# Patient Record
Sex: Male | Born: 1945 | Race: White | Hispanic: No | Marital: Married | Smoking: Never smoker
Health system: Southern US, Community
[De-identification: ages and names within clinical notes are randomized; demographics above are authoritative.]

## PROBLEM LIST (undated history)

## (undated) DIAGNOSIS — E119 Type 2 diabetes mellitus without complications: Secondary | ICD-10-CM

## (undated) DIAGNOSIS — E669 Obesity, unspecified: Secondary | ICD-10-CM

## (undated) DIAGNOSIS — G4733 Obstructive sleep apnea (adult) (pediatric): Secondary | ICD-10-CM

## (undated) DIAGNOSIS — G1221 Amyotrophic lateral sclerosis: Secondary | ICD-10-CM

## (undated) DIAGNOSIS — E785 Hyperlipidemia, unspecified: Secondary | ICD-10-CM

## (undated) DIAGNOSIS — F32A Depression, unspecified: Secondary | ICD-10-CM

## (undated) DIAGNOSIS — G4700 Insomnia, unspecified: Secondary | ICD-10-CM

## (undated) DIAGNOSIS — F329 Major depressive disorder, single episode, unspecified: Secondary | ICD-10-CM

## (undated) DIAGNOSIS — I10 Essential (primary) hypertension: Secondary | ICD-10-CM

## (undated) DIAGNOSIS — N400 Enlarged prostate without lower urinary tract symptoms: Secondary | ICD-10-CM

## (undated) DIAGNOSIS — M1990 Unspecified osteoarthritis, unspecified site: Secondary | ICD-10-CM

## (undated) DIAGNOSIS — F419 Anxiety disorder, unspecified: Secondary | ICD-10-CM

## (undated) DIAGNOSIS — K219 Gastro-esophageal reflux disease without esophagitis: Secondary | ICD-10-CM

## (undated) DIAGNOSIS — J329 Chronic sinusitis, unspecified: Secondary | ICD-10-CM

## (undated) DIAGNOSIS — E876 Hypokalemia: Secondary | ICD-10-CM

## (undated) HISTORY — DX: Obstructive sleep apnea (adult) (pediatric): G47.33

## (undated) HISTORY — DX: Hyperlipidemia, unspecified: E78.5

## (undated) HISTORY — DX: Type 2 diabetes mellitus without complications: E11.9

## (undated) HISTORY — DX: Insomnia, unspecified: G47.00

## (undated) HISTORY — DX: Benign prostatic hyperplasia without lower urinary tract symptoms: N40.0

## (undated) HISTORY — DX: Depression, unspecified: F32.A

## (undated) HISTORY — DX: Anxiety disorder, unspecified: F41.9

## (undated) HISTORY — DX: Chronic sinusitis, unspecified: J32.9

## (undated) HISTORY — DX: Major depressive disorder, single episode, unspecified: F32.9

## (undated) HISTORY — DX: Amyotrophic lateral sclerosis: G12.21

## (undated) HISTORY — DX: Unspecified osteoarthritis, unspecified site: M19.90

## (undated) HISTORY — DX: Hypokalemia: E87.6

## (undated) HISTORY — DX: Obesity, unspecified: E66.9

## (undated) HISTORY — DX: Essential (primary) hypertension: I10

---

## 1992-05-29 HISTORY — PX: INGUINAL HERNIA REPAIR: SUR1180

## 1996-05-29 HISTORY — PX: CHOLECYSTECTOMY: SHX55

## 1998-08-27 ENCOUNTER — Other Ambulatory Visit: Admission: RE | Admit: 1998-08-27 | Discharge: 1998-08-27 | Payer: Self-pay | Admitting: Gastroenterology

## 1999-09-08 ENCOUNTER — Encounter (INDEPENDENT_AMBULATORY_CARE_PROVIDER_SITE_OTHER): Payer: Self-pay | Admitting: Specialist

## 1999-09-08 ENCOUNTER — Other Ambulatory Visit: Admission: RE | Admit: 1999-09-08 | Discharge: 1999-09-08 | Payer: Self-pay | Admitting: Gastroenterology

## 2000-09-04 ENCOUNTER — Other Ambulatory Visit: Admission: RE | Admit: 2000-09-04 | Discharge: 2000-09-04 | Payer: Self-pay | Admitting: Gastroenterology

## 2000-09-04 ENCOUNTER — Encounter (INDEPENDENT_AMBULATORY_CARE_PROVIDER_SITE_OTHER): Payer: Self-pay

## 2001-06-23 ENCOUNTER — Emergency Department (HOSPITAL_COMMUNITY): Admission: EM | Admit: 2001-06-23 | Discharge: 2001-06-23 | Payer: Self-pay | Admitting: Emergency Medicine

## 2002-05-11 ENCOUNTER — Ambulatory Visit (HOSPITAL_COMMUNITY): Admission: RE | Admit: 2002-05-11 | Discharge: 2002-05-11 | Payer: Self-pay | Admitting: Internal Medicine

## 2002-07-03 ENCOUNTER — Ambulatory Visit (HOSPITAL_BASED_OUTPATIENT_CLINIC_OR_DEPARTMENT_OTHER): Admission: RE | Admit: 2002-07-03 | Discharge: 2002-07-03 | Payer: Self-pay | Admitting: Internal Medicine

## 2004-09-08 ENCOUNTER — Ambulatory Visit: Payer: Self-pay | Admitting: Gastroenterology

## 2004-11-15 ENCOUNTER — Ambulatory Visit: Payer: Self-pay | Admitting: Gastroenterology

## 2004-11-15 ENCOUNTER — Encounter (INDEPENDENT_AMBULATORY_CARE_PROVIDER_SITE_OTHER): Payer: Self-pay | Admitting: Specialist

## 2005-05-08 ENCOUNTER — Ambulatory Visit: Payer: Self-pay | Admitting: Gastroenterology

## 2006-10-02 ENCOUNTER — Ambulatory Visit (HOSPITAL_BASED_OUTPATIENT_CLINIC_OR_DEPARTMENT_OTHER): Admission: RE | Admit: 2006-10-02 | Discharge: 2006-10-02 | Payer: Self-pay | Admitting: Orthopaedic Surgery

## 2009-12-01 ENCOUNTER — Encounter: Payer: Self-pay | Admitting: Pulmonary Disease

## 2009-12-23 DIAGNOSIS — E785 Hyperlipidemia, unspecified: Secondary | ICD-10-CM

## 2009-12-23 DIAGNOSIS — G4730 Sleep apnea, unspecified: Secondary | ICD-10-CM | POA: Insufficient documentation

## 2009-12-23 DIAGNOSIS — F411 Generalized anxiety disorder: Secondary | ICD-10-CM | POA: Insufficient documentation

## 2009-12-23 DIAGNOSIS — E669 Obesity, unspecified: Secondary | ICD-10-CM

## 2009-12-23 DIAGNOSIS — M1710 Unilateral primary osteoarthritis, unspecified knee: Secondary | ICD-10-CM

## 2009-12-23 DIAGNOSIS — E876 Hypokalemia: Secondary | ICD-10-CM | POA: Insufficient documentation

## 2009-12-23 DIAGNOSIS — I10 Essential (primary) hypertension: Secondary | ICD-10-CM | POA: Insufficient documentation

## 2009-12-23 DIAGNOSIS — F329 Major depressive disorder, single episode, unspecified: Secondary | ICD-10-CM

## 2009-12-23 DIAGNOSIS — K5190 Ulcerative colitis, unspecified, without complications: Secondary | ICD-10-CM | POA: Insufficient documentation

## 2009-12-23 DIAGNOSIS — Z87898 Personal history of other specified conditions: Secondary | ICD-10-CM

## 2009-12-23 DIAGNOSIS — E119 Type 2 diabetes mellitus without complications: Secondary | ICD-10-CM | POA: Insufficient documentation

## 2009-12-23 DIAGNOSIS — G4700 Insomnia, unspecified: Secondary | ICD-10-CM

## 2009-12-24 ENCOUNTER — Ambulatory Visit: Payer: Self-pay | Admitting: Pulmonary Disease

## 2009-12-24 DIAGNOSIS — R05 Cough: Secondary | ICD-10-CM

## 2010-01-10 ENCOUNTER — Telehealth (INDEPENDENT_AMBULATORY_CARE_PROVIDER_SITE_OTHER): Payer: Self-pay | Admitting: *Deleted

## 2010-04-15 ENCOUNTER — Ambulatory Visit: Payer: Self-pay | Admitting: Cardiology

## 2010-04-15 ENCOUNTER — Ambulatory Visit: Payer: Self-pay | Admitting: Pulmonary Disease

## 2010-04-20 ENCOUNTER — Encounter: Payer: Self-pay | Admitting: Pulmonary Disease

## 2010-06-30 NOTE — Miscellaneous (Signed)
Summary: Orders Update  Clinical Lists Changes  Medications: Added new medication of AUGMENTIN 875-125 MG  TABS (AMOXICILLIN-POT CLAVULANATE) By mouth twice daily.  Take on full stomach with large glass of water. - Signed Rx of AUGMENTIN 875-125 MG  TABS (AMOXICILLIN-POT CLAVULANATE) By mouth twice daily.  Take on full stomach with large glass of water.;  #28 x 0;  Signed;  Entered by: Barbaraann Share MD;  Authorized by: Barbaraann Share MD;  Method used: Electronically to [facility] [address], [place], [place]  [id number], Ph: [phone], Fax: [phone]    Prescriptions: AUGMENTIN 875-125 MG  TABS (AMOXICILLIN-POT CLAVULANATE) By mouth twice daily.  Take on full stomach with large glass of water.  #28 x 0   Entered and Authorized by:   Barbaraann Share MD   Signed by:   Barbaraann Share MD on 04/20/2010   Method used:   Electronically to        [facility]* (retail)       [address]       [place], [place]  [id number]       Ph: [phone]       Fax: [phone]   RxID:   [id number]

## 2010-06-30 NOTE — Progress Notes (Signed)
Summary: TALK TO DR Tonny Branch  Phone Note Call from Patient Call back at [phone]   Caller: Patient Call For: [facility] Summary of Call: PT WAS TOLD TO CALL AND TALK TO DR [facility] AFTER STARTING MEDICATION. Initial call taken by: Rickard Patience,  January 10, 2010 10:55 AM  Follow-up for Phone Call        Pt c/o no significant difference in cough, had an episode early AM around 3 or 4 am coughing, having to sit in recliner for relief, wheezing, productive cough with clear mucus. Cough is less during the day. Does not feel Dexilant has helped and received little relief from Tussicaps. Pt wants to know the next step. Pt stated he has not had a chance to go to Dr. Kevan Ny office to get old CXR but will when he can for [facility] review. Please advise. Thanks. Zackery Barefoot CMA  January 10, 2010 11:08 AM   Additional Follow-up for Phone Call Additional follow up Details #1::        he needs ov to let me examine him and check cxr same day.  he needs to bring old film with him for comparison. Additional Follow-up by: Barbaraann Share MD,  January 10, 2010 5:23 PM    Additional Follow-up for Phone Call Additional follow up Details #2::    [id number].  Aundra Millet Reynolds LPN  January 11, 2010 9:12 AM     Spoke with pt-aware of KC's recommendations-states he just had a CXR-told him he needed to bring that with him to the appt-pt said okay and hung up. Was unable to make appt for pt and spoke with [facility] about this.Reynaldo Minium CMA  January 11, 2010 10:06 AM

## 2010-06-30 NOTE — Assessment & Plan Note (Signed)
Summary: acute sick visit for chronic cough   Visit Type:  Follow-up Primary Jouri Threat/Referring Trygve Thal:  Pearla Dubonnet  CC:  f/u. pt c/o cough w/ clear phlem and is  worse at night when lying down and wheezing at nightnever smoker.  History of Present Illness: the pt comes in today for f/u of his chronic cough.  At the last visit, this was felt to be more upper airway in origin than lower, and he was treated with the cyclical cough protocol and PPI for possible LPR.  His spirometry at the time was normal.  He comes in today where he continues to have a significant cough, and is getting frustrated as expected.  He does not feel the PPI or the cough suppressant helped.  He continues to have issues primarily at night while lying down, and describes classic upper airway pseudowheezing.  He describes a "choking sensation" in his upper airway.  Current Medications (verified): 1)  Zolpidem Tartrate 10 Mg Tabs (Zolpidem Tartrate) .Marland Kitchen.. 1 By Mouth At Bedtime As Needed 2)  Benicar Hct 40-12.5 Mg Tabs (Olmesartan Medoxomil-Hctz) .Marland Kitchen.. 1 By Mouth Daily 3)  Fish Oil 1000 Mg Caps (Omega-3 Fatty Acids) .... Take 3 Tabs By Mouth Daily 4)  Co Q-10 120 Mg Caps (Coenzyme Q10) .... Once Daily 5)  Klor-Con M20 20 Meq Cr-Tabs (Potassium Chloride Crys Cr) .... 1/2 By Mouth Daily 6)  Metformin Hcl 500 Mg Tabs (Metformin Hcl) .... 1/2 By Mouth Two Times A Day 7)  Norvasc 5 Mg Tabs (Amlodipine Besylate) .Marland Kitchen.. 1 1/2 By Mouth Daily 8)  Doxazosin Mesylate 4 Mg Tabs (Doxazosin Mesylate) .... Take 1 Tablet By Mouth Once A Day 9)  Omeprazole 40 Mg Cpdr (Omeprazole) .... One in Am and Pm  Allergies (verified): 1)  ! * Decongestants 2)  ! * Hytrin 3)  ! * Beta Blockers  Past History:  Past medical, surgical, family and social histories (including risk factors) reviewed, and no changes noted (except as noted below).  Past Medical History: Reviewed history from 12/24/2009 and no changes required.  UNSPECIFIED  ULCERATIVE COLITIS (ICD-556.9) DEGENERATIVE JOINT DISEASE, KNEES, BILATERAL (ICD-715.96) INSOMNIA (ICD-780.52) OBESITY (ICD-278.00) BENIGN PROSTATIC HYPERTROPHY, HX OF (ICD-V13.8) ANXIETY (ICD-300.00) HYPOKALEMIA (ICD-276.8) SLEEP APNEA (ICD-780.57) HYPERTENSION (ICD-401.9) HYPERLIPIDEMIA (ICD-272.4) DIABETES, TYPE 2 (ICD-250.00) DEPRESSION (ICD-311)  Past Surgical History: Reviewed history from 12/24/2009 and no changes required. Cholecystectomy in 1998 Left inguinal hernia repair in 10/1992  Family History: Reviewed history from 12/24/2009 and no changes required. asthma: uncle heart disease: father (m.i.)  Social History: Reviewed history from 12/24/2009 and no changes required. Patient never smoked.  pt is married and lives with wife. pt has children. pt is a retired International aid/development worker.   Review of Systems       The patient complains of productive cough and nasal congestion/difficulty breathing through nose.  The patient denies shortness of breath with activity, shortness of breath at rest, non-productive cough, coughing up blood, chest pain, irregular heartbeats, acid heartburn, indigestion, loss of appetite, weight change, abdominal pain, difficulty swallowing, sore throat, tooth/dental problems, headaches, sneezing, itching, ear ache, anxiety, depression, hand/feet swelling, joint stiffness or pain, rash, change in color of mucus, and fever.    Vital Signs:  Patient profile:   65 year old male Height:      69 inches Weight:      213.25 pounds BMI:     31.61 O2 Sat:      96 % on Room air Temp:     98.0 degrees F  oral Pulse rate:   68 / minute BP sitting:   140 / 90  (left arm) Cuff size:   regular  Vitals Entered By: Carver Fila (April 15, 2010 3:37 PM)  O2 Flow:  Room air CC: f/u. pt c/o cough w/ clear phlem and is  worse at night when lying down, wheezing at nightnever smoker Comments meds and allergies updated Phone number updated Carver Fila  April 15, 2010 3:37 PM    Physical Exam  General:  ow male in nad Nose:  no purulence from nares or drainage Mouth:  clear, no exudates Lungs:  totally clear to auscultation Heart:  rrr, on mrg Extremities:  no significant edema or cyanosis  Neurologic:  alert and oriented, moves all 4.   Impression & Recommendations:  Problem # 1:  COUGH, CHRONIC (ICD-786.2) the pt continues to have ongoing cough that occurs primarily at night, and sounds most c/w an upper airway source.  He has not responded to a SHORT course of PPI and cough suppression, and we need to do further investigation at this point.  Will need to have limited ct of his sinuses to r/o chronic sinusitis, and I would like to continue with PPI during this time.  If this fails to show anything, would proceed with methacholine challenge testing to r/o cough variant asthma.  The pt is agreeable with this approach  Medications Added to Medication List This Visit: 1)  Co Q-10 120 Mg Caps (Coenzyme q10) .... Once daily 2)  Symbicort 80-4.5 Mcg/act Aero (Budesonide-formoterol fumarate) .... As needed 3)  Omeprazole 40 Mg Cpdr (Omeprazole) .... One in am and pm  Other Orders: Radiology Referral (Radiology) Pulmonary Referral (Pulmonary) Est. Patient Level IV (62952)  Patient Instructions: 1)  will treat emperically with omeprazole 40mg  one in am and pm 2)  will schedule for scan of your sinuses..will call you with results. 3)  will schedule for methacholine challenge testing, but can cancel if something turns up on sinus xrays.   Prescriptions: OMEPRAZOLE 40 MG CPDR (OMEPRAZOLE) one in am and pm  #60 x 0   Entered and Authorized by:   Barbaraann Share MD   Signed by:   Barbaraann Share MD on 04/15/2010   Method used:   Print then Give to Patient   RxID:   [id number]    Immunization History:  Influenza Immunization History:    Influenza:  historical (02/26/2010)

## 2010-06-30 NOTE — Assessment & Plan Note (Signed)
Summary: self referral for chronic cough   Primary Provider/Referring Provider:  Pearla Dubonnet  CC:  self referral for chronic cough.  History of Present Illness: The pt is a 65y/o male who comes in today as a self referral for evaluation of chronic cough.  He noted the onset of cough approx. 4 mos ago, but unsure if associated with a URI?  It has not been progressive in nature, and he describes a "crackle and pop" in the middle of his chest at night.  His cough is primarily dry, but can produce thumbnails of mucus that are clear to yellow tinged.  He does have paroxysms of cough at night, and increase when he lies down to go to sleep.  What is unusual is that he denies any cough with heavy exercise.  He has some postnasal drip, but denies sinus issues or symptoms.  He denies any reflux symptoms, and has not been on an ACE recently.  He has had a cxr recently that is unremarkable.  He has been tried on prednisone for 10 days and symbicort, and the cough resolved as long as he was taking this.  He has never had spirometry.  He has not been on prednisone within the last 2 weeks.  He had been taking symbicort until just recently.  Preventive Screening-Counseling & Management  Alcohol-Tobacco     Smoking Status: never  Current Medications (verified): 1)  Sulfasalazine 500 Mg Tabs (Sulfasalazine) .... Take 1 Tablet By Mouth Two Times A Day 2)  Zolpidem Tartrate 10 Mg Tabs (Zolpidem Tartrate) .Marland Kitchen.. 1 By Mouth At Bedtime As Needed 3)  Benicar Hct 40-12.5 Mg Tabs (Olmesartan Medoxomil-Hctz) .Marland Kitchen.. 1 By Mouth Daily 4)  Fish Oil 1000 Mg Caps (Omega-3 Fatty Acids) .... Take 3 Tabs By Mouth Daily 5)  Coq10 100 Mg Caps (Coenzyme Q10) .Marland Kitchen.. 1 By Mouth Daily 6)  Klor-Con M20 20 Meq Cr-Tabs (Potassium Chloride Crys Cr) .... 1/2 By Mouth Daily 7)  Metformin Hcl 500 Mg Tabs (Metformin Hcl) .... 1/2 By Mouth Two Times A Day 8)  Norvasc 5 Mg Tabs (Amlodipine Besylate) .Marland Kitchen.. 1 1/2 By Mouth Daily 9)  Doxazosin  Mesylate 4 Mg Tabs (Doxazosin Mesylate) .... Take 1 Tablet By Mouth Once A Day  Allergies (verified): 1)  ! * Decongestants 2)  ! * Hytrin 3)  ! * Beta Blockers  Past History:  Past Medical History:  UNSPECIFIED ULCERATIVE COLITIS (ICD-556.9) DEGENERATIVE JOINT DISEASE, KNEES, BILATERAL (ICD-715.96) INSOMNIA (ICD-780.52) OBESITY (ICD-278.00) BENIGN PROSTATIC HYPERTROPHY, HX OF (ICD-V13.8) ANXIETY (ICD-300.00) HYPOKALEMIA (ICD-276.8) SLEEP APNEA (ICD-780.57) HYPERTENSION (ICD-401.9) HYPERLIPIDEMIA (ICD-272.4) DIABETES, TYPE 2 (ICD-250.00) DEPRESSION (ICD-311)  Past Surgical History: Cholecystectomy in 1998 Left inguinal hernia repair in 10/1992  Family History: Reviewed history and no changes required. asthma: uncle heart disease: father (m.i.)  Social History: Reviewed history and no changes required. Patient never smoked.  pt is married and lives with wife. pt has children. pt is a retired International aid/development worker. Smoking Status:  never  Review of Systems       The patient complains of productive cough.  The patient denies shortness of breath with activity, shortness of breath at rest, non-productive cough, coughing up blood, chest pain, irregular heartbeats, acid heartburn, indigestion, loss of appetite, weight change, abdominal pain, difficulty swallowing, sore throat, tooth/dental problems, headaches, nasal congestion/difficulty breathing through nose, sneezing, itching, ear ache, anxiety, depression, hand/feet swelling, joint stiffness or pain, rash, change in color of mucus, and fever.    Vital Signs:  Patient profile:  65 year old male Height:      69 inches Weight:      206.50 pounds BMI:     30.60 O2 Sat:      95 % on Room air Temp:     98.1 degrees F oral Pulse rate:   67 / minute BP sitting:   128 / 82  (left arm) Cuff size:   regular  Vitals Entered By: Arman Filter LPN (December 24, 2009 11:29 AM)  O2 Flow:  Room air CC: self referral for chronic  cough Comments Medications reviewed with patient Arman Filter LPN  December 24, 2009 11:29 AM    Physical Exam  General:  wd male in nad Eyes:  PERRLA and EOMI.  PERRLA and EOMI.   Nose:  patent on right, mod turbinate hypertrophy and edema on left with narrowing. Mouth:  large tonsils, no exudates, no lesions. Neck:  no LN, TMG, JVD Lungs:  totally clear to auscultation Heart:  rrr, no mrg Abdomen:  no swelling or tenderness, bs+ Extremities:  no edema or cyanosis pulses intact distally Neurologic:  alert and oriented, moves all 4.   Impression & Recommendations:  Problem # 1:  COUGH, CHRONIC (ICD-786.2) the pt has a chronic cough of 4mos duration of unclear etiology.  There are some features that suggest an upper airway origin, but on the other hand gets better with symbicort and prednisone.  I question whether he may have a postviral bronchiolitis.  His spirometry today is totally wnl, but he was taking symbicort until just recently.  At this point, would like to treat emperically for LPR and a cyclical cough.  I have also reviewed the behavioral treatments as well for an upper airway cough.  I have asked him to stay off symbicort during this time.  He is to let me know how things are going.  If he does not get better, will repeat spirometry off symbicort vs. methacholine challenge to put issue of ?asthma to rest.  Medications Added to Medication List This Visit: 1)  Sulfasalazine 500 Mg Tabs (Sulfasalazine) .... Take 1 tablet by mouth two times a day 2)  Fish Oil 1000 Mg Caps (Omega-3 fatty acids) .... Take 3 tabs by mouth daily 3)  Doxazosin Mesylate 4 Mg Tabs (Doxazosin mesylate) .... Take 1 tablet by mouth once a day 4)  Tussicaps 10-8 Mg Xr12h-cap (Hydrocod polst-chlorphen polst) .... One at hs  Other Orders: New Patient Level IV (29562)  Patient Instructions: 1)  trial off symbicort for 2 weeks. 2)  take dexilant 60mg  each am for next 2weeks 3)  tussicaps each night after  dinner for a week 4)  hard candy to bathe back of throat all during the day...no menthol or peppermint 5)  avoid throat clearing if possible....drink something.  Minimize voice use as well. 6)  Call me with progress in 2 weeks, but sooner if you think you are getting worse.  Prescriptions: TUSSICAPS 10-8 MG XR12H-CAP (HYDROCOD POLST-CHLORPHEN POLST) one at HS  #10 x 0   Entered and Authorized by:   Barbaraann Share MD   Signed by:   Barbaraann Share MD on 12/24/2009   Method used:   Print then Give to Patient   RxID:   [id number]    CardioPerfect Spirometry  ID: [id number] Patient: Paul Daniels DOB: 01-26-1946 Age: 65 Years Old Sex: Male Race: White Physician: Clance Height: 69 Weight: 206.50 Status: Unconfirmed Past Medical History:  Current Problems:  UNSPECIFIED ULCERATIVE COLITIS (ICD-556.9) DEGENERATIVE  JOINT DISEASE, KNEES, BILATERAL (ICD-715.96) INSOMNIA (ICD-780.52) OBESITY (ICD-278.00) BENIGN PROSTATIC HYPERTROPHY, HX OF (ICD-V13.8) ANXIETY (ICD-300.00) HYPOKALEMIA (ICD-276.8) SLEEP APNEA (ICD-780.57) HYPERTENSION (ICD-401.9) HYPERLIPIDEMIA (ICD-272.4) DIABETES, TYPE 2 (ICD-250.00) DEPRESSION (ICD-311)  Recorded: 12/24/2009 12:10 AM  Parameter  Measured Predicted %Predicted FVC     3.81        4.45        85.50 FEV1     2.98        3.33        89.30 FEV1%   78.18        74.84        104.50 PEF    11.66        8.66        134.60   Interpretation: spirometry is normal

## 2010-10-14 NOTE — Op Note (Signed)
NAMEARNET, HOFFERBER                ACCOUNT NO.:  [id number]   MEDICAL RECORD NO.:  [id number]          PATIENT TYPE:  AMB   LOCATION:  DSC                          FACILITY:  [facility]   PHYSICIAN:  Lubertha Basque. Dalldorf, M.D.DATE OF BIRTH:  Aug 17, 1945   DATE OF PROCEDURE:  10/02/2006  DATE OF DISCHARGE:                               OPERATIVE REPORT   PREOPERATIVE DIAGNOSES:  1. Left knee loose bodies.  2. Left knee degenerative joint disease.   POSTOPERATIVE DIAGNOSES:  1. Left knee torn medial meniscus.  2. Left knee degenerative joint disease.   PROCEDURES:  1. Left knee partial meniscectomy.  2. Left knee abrasion chondroplasty patellofemoral and lateral.   ANESTHESIA:  General.   ATTENDING SURGEON:  Lubertha Basque. Jerl Santos, M.D.   ASSISTANT:  Lindwood Qua, P.A.   INDICATIONS FOR PROCEDURE:  The patient is a 65 year old man with a long  history of left knee pain.  By x-ray he had some mild to moderate joint  space narrowing with two loose bodies which seemed evident and obvious  on the lateral view.  At least one of these seems to be in the posterior  aspect of the knee joint.  He has pain which limits his ability to  remain active and to rest.  He has failed some conservative measures and  he is offered an arthroscopy.  Informed operative consent was obtained  after discussion of possible complications including reaction to  anesthesia, infection, neurovascular injury.   SUMMARY OF FINDINGS AND PROCEDURE:  Under general anesthesia through  three portals an arthroscopy of the left knee was performed.  Suprapatellar pouch was benign while the patellofemoral joint exhibited  some grade 3 and 4 change.  A thorough chondroplasty was done along with  abrasion to bleeding bone in some small areas.  In the medial  compartment, he had a degenerative tear of the posterior horn of the  medial meniscus addressed with a partial medial meniscectomy, performing  about a 5% resection of the  structure back to stable tissues.  He also  had some grade-3 change across the broad area of the medial femoral  condyle addressed with a chondroplasty.  The ACL was intact.  The  lateral compartment exhibited some degeneration of the lateral meniscus  but no real tear was seen.  He did have a linear area of grade 4 change  across the lateral femoral condyle addressed with abrasion to bleeding  bone in some small areas.  We did enter the posterior aspect of the knee  but could find no significant loose bodies in that area.   DESCRIPTION OF PROCEDURE:  The patient was taken to the operating suite  where general anesthetic was applied without difficulty.  He was  positioned supine and prepped and draped in normal sterile fashion.  After the administration of IV Kefzol an arthroscopy of the left knee  was formed through a total of three portals.  Findings were as noted  above.  The procedure consisted of the partial medial meniscectomy  followed by abrasion chondroplasty, both lateral and patellofemoral.  We  did perform  a posteromedial arthroscopy portal first with a spinal  needle followed by a small skin incision and blunt dissection into the  capsule.  I examined the posterior aspect of the knee both medial and  lateral with the scope through the notch and the aforementioned  posteromedial instrument portal and could find no significant loose  body.  We did address some synovitis with the shaver but no bony loose  body could be found in this portion of the knee.  The knee was  thoroughly irrigated at the end of the case followed by placement of  Marcaine with epinephrine and morphine plus Depo-Medrol.  Adaptic was  placed over the portals followed by dry gauze and a loose Ace wrap.  Estimated blood loss and IV fluids can be obtained from anesthesia  records.   DISPOSITION:  The patient was extubated in the operating room and taken  to the recovery room in stable addition.  He was to go  home the same day  and follow up in the office next week.  I will contact him by phone  tonight.      Lubertha Basque Jerl Santos, M.D.  Electronically Signed     PGD/MEDQ  D:  10/02/2006  T:  10/02/2006  Job:  [id number]

## 2011-02-01 ENCOUNTER — Encounter: Payer: Self-pay | Admitting: Pulmonary Disease

## 2011-02-02 ENCOUNTER — Ambulatory Visit (INDEPENDENT_AMBULATORY_CARE_PROVIDER_SITE_OTHER): Payer: Medicare Other | Admitting: Pulmonary Disease

## 2011-02-02 ENCOUNTER — Encounter: Payer: Self-pay | Admitting: Pulmonary Disease

## 2011-02-02 VITALS — BP 142/86 | HR 70 | Temp 97.8°F | Ht 69.0 in | Wt 210.4 lb

## 2011-02-02 DIAGNOSIS — G4700 Insomnia, unspecified: Secondary | ICD-10-CM

## 2011-02-02 NOTE — Patient Instructions (Signed)
Will reschedule for a consult slot next week.

## 2011-02-02 NOTE — Progress Notes (Signed)
  Subjective:    Patient ID: Dominion Kathan, male    DOB: Oct 27, 1945, 65 y.o.   MRN: [id number]  HPI No visit.  He needs to be scheduled in a consult slot for evaluation of insomnia   Review of Systems  Constitutional: Negative for fever and unexpected weight change.  HENT: Positive for congestion. Negative for ear pain, nosebleeds, sore throat, rhinorrhea, sneezing, trouble swallowing, dental problem, postnasal drip and sinus pressure.   Eyes: Negative for redness and itching.  Respiratory: Negative for cough, chest tightness, shortness of breath and wheezing.   Cardiovascular: Negative for palpitations and leg swelling.  Gastrointestinal: Negative for nausea and vomiting.  Genitourinary: Negative for dysuria.  Musculoskeletal: Negative for joint swelling.  Skin: Negative for rash.  Neurological: Negative for headaches.  Hematological: Does not bruise/bleed easily.  Psychiatric/Behavioral: Negative for dysphoric mood. The patient is not nervous/anxious.        Objective:   Physical Exam        Assessment & Plan:

## 2011-02-07 ENCOUNTER — Other Ambulatory Visit: Payer: Self-pay | Admitting: Gastroenterology

## 2011-02-08 ENCOUNTER — Ambulatory Visit (INDEPENDENT_AMBULATORY_CARE_PROVIDER_SITE_OTHER): Payer: Medicare Other | Admitting: Pulmonary Disease

## 2011-02-08 ENCOUNTER — Encounter: Payer: Self-pay | Admitting: Pulmonary Disease

## 2011-02-08 VITALS — BP 122/78 | HR 62 | Temp 98.2°F | Ht 69.0 in | Wt 210.6 lb

## 2011-02-08 DIAGNOSIS — G4700 Insomnia, unspecified: Secondary | ICD-10-CM

## 2011-02-08 MED ORDER — TRAZODONE HCL 50 MG PO TABS: ORAL_TABLET | ORAL | Status: DC

## 2011-02-08 NOTE — Progress Notes (Signed)
Subjective:    Patient ID: Canyon Lohr, male    DOB: 08-22-1945, 65 y.o.   MRN: [id number]  HPI The patient is a 65 year old male who comes in today as a self-referral for evaluation of insomnia.  The patient states that he has had issues for at least 20 years, but probably predates even this.  The last few years it has gotten worse, especially the last 6 months.  He has had many different evaluations, including cognitive behavioral therapy at [facility].  However, he never participated in the full program by his description.  The patient currently does relaxation techniques and ritualistic behaviors before going to bed.  He tries to go to bed between 11 and 12 midnight, and does not watch TV in bed.  He does however read in bed.  If he takes multiple sleep medications, he can sleep for 3-4 hours maximum.  If he doesn't take sleep medications, a few nights he may get to sleep within 30 minutes, and others may be an hour or longer.  He has tried for his taking Xanax, Sinequan, Ambien, Elavil, and melatonin.  The patient feels that he is typically a sleep by 12:30 or 1:00, but will usually awaken 2-3 hours later.  He rarely goes back to sleep once he awakens.  He will usually leaves the bedroom and go to the family room where he will either watch TV, read, or illicit audio books.  He will often get on his computer, and we'll also fix something to eat.  6/7 nights he does not get back to sleep until 3 to 4 AM, and he awakens at 5:30 AM no matter how much sleep he has gotten.  In the mornings he will usually drink 2-4 cups of coffee but rarely any caffeinated beverages after 8:00.  He states he does not nap during the day except on rare circumstances.  His Epworth score today is zero   Review of Systems  Constitutional: Negative for fever and unexpected weight change.  HENT: Positive for congestion. Negative for ear pain, nosebleeds, sore throat, rhinorrhea, sneezing, trouble swallowing, dental  problem, postnasal drip and sinus pressure.   Eyes: Negative for redness and itching.  Respiratory: Negative for cough, chest tightness, shortness of breath and wheezing.   Cardiovascular: Negative for palpitations and leg swelling.  Gastrointestinal: Negative for nausea and vomiting.  Genitourinary: Negative for dysuria.  Musculoskeletal: Negative for joint swelling.  Skin: Negative for rash.  Neurological: Negative for headaches.  Hematological: Does not bruise/bleed easily.  Psychiatric/Behavioral: Negative for dysphoric mood. The patient is not nervous/anxious.        Objective:   Physical Exam Constitutional:  Well developed, no acute distress  HENT:  Nares patent without discharge, but narrowed on left  Oropharynx without exudate, palate and uvula are normal, increased tonsils.   Eyes:  Perrla, eomi, no scleral icterus  Neck:  No JVD, no TMG  Cardiovascular:  Normal rate, regular rhythm, no rubs or gallops.  No murmurs        Intact distal pulses  Pulmonary :  Normal breath sounds, no stridor or respiratory distress   No rales, rhonchi, or wheezing  Abdominal:  Soft, nondistended, bowel sounds present.  No tenderness noted.   Musculoskeletal:  No lower extremity edema noted.  Lymph Nodes:  No cervical lymphadenopathy noted  Skin:  No cyanosis noted  Neurologic:  Alert, appropriate, moves all 4 extremities without obvious deficit.         Assessment &  Plan:

## 2011-02-08 NOTE — Patient Instructions (Signed)
Try to avoid alcohol and your current sleep meds before bedtime Do not go to bed before 2-3am, and get out of bed around 7-8am if possible, no later Can take trazodone 50mg  near bedtime, and take melatonin 3mg  3-4 hours before bedtime. If cannot initiate sleep, do not stay in bed as you have been doing.   NO sleeping during day, no reading in bed, no computer after 10pm, and stay out of bedroom completely during day and early evening. No eating if you are up during the middle of the night Please call me in one to two weeks to give me feedback on how things are going.

## 2011-02-08 NOTE — Assessment & Plan Note (Signed)
The patient has long-standing issues of at least 20 years with sleep onset and maintenance.  However, in reality this is been going on even longer than this.  From his description, I suspect the patient is an "owl", and therefore this may simply be a manifestation of a circadian rhythm disturbance.  I had a long conversation with him about the various treatments for insomnia, and explained to him that medications are never the answer.  He has worked very hard on his sleep hygiene, however there are a few things that we can improve.  I have asked him to stay away from the computer at night, and not to read while in bed or eat when awake during the middle of the night.  I suspect he is getting more sleep than he realizes, and more than likely is catnapping during the early morning hours.  He has not really tried sleep restriction therapy, and I would like to start with this.  More than likely, he is going to need intense cognitive behavioral therapy.  He has never really been through the whole program.

## 2011-07-22 ENCOUNTER — Emergency Department (HOSPITAL_COMMUNITY)
Admission: EM | Admit: 2011-07-22 | Discharge: 2011-07-22 | Disposition: A | Discharge status: Home / Self Care | Payer: Medicare Other | Attending: Emergency Medicine | Admitting: Emergency Medicine

## 2011-07-22 ENCOUNTER — Encounter (HOSPITAL_COMMUNITY): Payer: Self-pay

## 2011-07-22 DIAGNOSIS — E785 Hyperlipidemia, unspecified: Secondary | ICD-10-CM | POA: Insufficient documentation

## 2011-07-22 DIAGNOSIS — R339 Retention of urine, unspecified: Secondary | ICD-10-CM | POA: Insufficient documentation

## 2011-07-22 DIAGNOSIS — F329 Major depressive disorder, single episode, unspecified: Secondary | ICD-10-CM | POA: Insufficient documentation

## 2011-07-22 DIAGNOSIS — F3289 Other specified depressive episodes: Secondary | ICD-10-CM | POA: Insufficient documentation

## 2011-07-22 DIAGNOSIS — N400 Enlarged prostate without lower urinary tract symptoms: Secondary | ICD-10-CM | POA: Insufficient documentation

## 2011-07-22 DIAGNOSIS — F411 Generalized anxiety disorder: Secondary | ICD-10-CM | POA: Insufficient documentation

## 2011-07-22 DIAGNOSIS — I10 Essential (primary) hypertension: Secondary | ICD-10-CM | POA: Insufficient documentation

## 2011-07-22 DIAGNOSIS — E119 Type 2 diabetes mellitus without complications: Secondary | ICD-10-CM | POA: Insufficient documentation

## 2011-07-22 DIAGNOSIS — M1990 Unspecified osteoarthritis, unspecified site: Secondary | ICD-10-CM | POA: Insufficient documentation

## 2011-07-22 LAB — URINALYSIS, ROUTINE W REFLEX MICROSCOPIC
Nitrite: NEGATIVE
Specific Gravity, Urine: 1.022 (ref 1.005–1.030)
Urobilinogen, UA: 0.2 mg/dL (ref 0.0–1.0)

## 2011-07-22 MED ORDER — MORPHINE SULFATE 4 MG/ML IJ SOLN
4.0000 mg | Freq: Once | INTRAMUSCULAR | Status: AC
  Administered 2011-07-22: 4 mg via INTRAMUSCULAR
  Filled 2011-07-22: qty 1

## 2011-07-22 NOTE — ED Provider Notes (Signed)
History     CSN: [id number]  Arrival date & time 07/22/11  4098   First MD Initiated Contact with Patient 07/22/11 1006      Chief Complaint  Patient presents with  . Urinary Retention    (Consider location/radiation/quality/duration/timing/severity/associated sxs/prior treatment) HPI History provided by pt.   Pt has h/o BPH and occasionally experiences urinary retention.  Was seen by urology for this problem 3 days ago and found to have 850cc urine retained in bladder.  A foley was not placed at that time.  He comes to the ED today because he is still unable to urinate.  Voided a few drops this am.  Called Dr. Wilson Singer who recommended he come to the ED for foley placement.  Pt denies fever, abdominal discomfort, dysuria, hematuria.    Past Medical History  Diagnosis Date  . Ulcerative colitis   . DJD (degenerative joint disease)     bilateral knee's  . Insomnia   . Obesity   . BPH (benign prostatic hyperplasia)   . Anxiety   . OSA (obstructive sleep apnea)   . Hypokalemia   . Hypertension   . Hyperlipidemia   . Diabetes mellitus, type 2   . Depression     Past Surgical History  Procedure Date  . Cholecystectomy 1998  . Inguinal hernia repair 1994    Family History  Problem Relation Age of Onset  . Asthma      uncle  . Heart attack Father     History  Substance Use Topics  . Smoking status: Never Smoker   . Smokeless tobacco: Not on file  . Alcohol Use: Not on file      Review of Systems  All other systems reviewed and are negative.    Allergies  Beta adrenergic blockers and Terazosin hcl  Home Medications   Current Outpatient Rx  Name Route Sig Dispense Refill  . ALPRAZOLAM 0.5 MG PO TABS  Take 1/2 to 1 tab at bedtime as needed.     Marland Kitchen AMLODIPINE BESYLATE 5 MG PO TABS  1 1/2 tablets daily     . CO Q-10 120 MG PO CAPS  Once a day     . DOXAZOSIN MESYLATE 4 MG PO TABS Oral Take 4 mg by mouth at bedtime.      Marland Kitchen DOXEPIN HCL 10 MG PO CAPS Oral Take 10  mg by mouth at bedtime as needed.      . OMEGA-3 FATTY ACIDS 1000 MG PO CAPS  3 tablets daily     . METFORMIN HCL ER (MOD) 500 MG PO TB24  1/2 tablet twice a day     . OLMESARTAN MEDOXOMIL-HCTZ 40-12.5 MG PO TABS Oral Take 1 tablet by mouth daily.      Marland Kitchen POTASSIUM CHLORIDE CRYS ER 20 MEQ PO TBCR  1/2 tablet twice a day     . TRAZODONE HCL 50 MG PO TABS  Take as directed 30 tablet 0  . ZOLPIDEM TARTRATE 10 MG PO TABS Oral Take 10 mg by mouth at bedtime as needed.        BP 152/96  Pulse 78  Temp(Src) 97.6 F (36.4 C) (Oral)  Resp 16  Ht 5\' 10"  (1.778 m)  Wt 180 lb (81.647 kg)  BMI 25.83 kg/m2  SpO2 99%  Physical Exam  Nursing note and vitals reviewed. Constitutional: He is oriented to person, place, and time. He appears well-developed and well-nourished. No distress.  HENT:  Head: Normocephalic and atraumatic.  Eyes:  Normal appearance  Neck: Normal range of motion.  Cardiovascular: Normal rate and regular rhythm.   Pulmonary/Chest: Effort normal and breath sounds normal.  Abdominal: Soft. Bowel sounds are normal. He exhibits no distension. There is no tenderness. There is no guarding.       No CVA tenderness  Neurological: He is alert and oriented to person, place, and time.  Skin: Skin is warm and dry. No rash noted.  Psychiatric: He has a normal mood and affect. His behavior is normal.    ED Course  Procedures (including critical care time)  Labs Reviewed  URINALYSIS, ROUTINE W REFLEX MICROSCOPIC - Abnormal; Notable for the following:    Glucose, UA 100 (*)    All other components within normal limits   No results found.   1. Urinary retention       MDM  Pt w/ h/o BPH referred to ED by his urologist for foley placement for urinary retention.  Pt is currently asymptomatic and in NAD.  Foley placed and 1500cc urine relieved.  U/A neg for infection.  Pt d/c'd home with a leg bag.  He has a f/u appt w/ Dr. Laverle Patter on 08/03/11 and was told that the foley would be  removed at that time.     Medical screening examination/treatment/procedure(s) were conducted as a shared visit with non-physician practitioner(s) and myself.  I personally evaluated the patient during the encounter 66 yo man with known BPH who developed urinary retention, with release of appx 1500 ml's of urine when catheter was inserted.  Leg bag, urology followup advised.  Pt says Dr. Bjorn Pippin, urologist, advised him to leave the catheter in until March 7, when he will have cystoscopy and urodynamic testing. Osvaldo Human, M.D.      Arie Sabina [place], PA 07/22/11 1143  Carleene Cooper III, MD 07/22/11 [phone]

## 2011-07-22 NOTE — ED Notes (Signed)
Pt was at urologist office on Wednesday and had 850 ml urinary retention, pt has not had much urine output since than and was told to come to ed for cath placement.

## 2011-07-22 NOTE — ED Notes (Signed)
Urologist called prior to PT arrival, states pt needs foley catheter and to follow up with them on monday

## 2011-07-22 NOTE — ED Notes (Signed)
1500 ml urine output after cath placement.

## 2011-07-22 NOTE — ED Notes (Signed)
Pt given discharge instructions and teaching for leg bag care , verb understanding, amb with steady gait to discharge windwo

## 2011-07-22 NOTE — Discharge Instructions (Signed)
Follow up with your urologist as scheduled.  You may return to the ER if you have any other concerns.

## 2011-08-04 ENCOUNTER — Encounter (HOSPITAL_COMMUNITY): Payer: Self-pay | Admitting: Pharmacy Technician

## 2011-08-04 ENCOUNTER — Other Ambulatory Visit: Payer: Self-pay | Admitting: Urology

## 2011-08-08 ENCOUNTER — Encounter (HOSPITAL_COMMUNITY)
Admission: RE | Admit: 2011-08-08 | Discharge: 2011-08-08 | Disposition: A | Discharge status: Home / Self Care | Payer: Medicare Other | Source: Ambulatory Visit | Attending: Urology | Admitting: Urology

## 2011-08-08 ENCOUNTER — Encounter (HOSPITAL_COMMUNITY): Payer: Self-pay

## 2011-08-08 LAB — BASIC METABOLIC PANEL
Chloride: 99 mEq/L (ref 96–112)
GFR calc Af Amer: >90 mL/min (ref >90)
GFR calc non Af Amer: 85 mL/min — ABNORMAL LOW (ref >90)
Potassium: 3.4 mEq/L — ABNORMAL LOW (ref 3.5–5.1)
Sodium: 138 mEq/L (ref 135–145)

## 2011-08-08 LAB — CBC
MCHC: 36.1 g/dL — ABNORMAL HIGH (ref 30.0–36.0)
Platelets: 161 10*3/uL (ref 150–400)
RDW: 12.9 % (ref 11.5–15.5)
WBC: 6.3 10*3/uL (ref 4.0–10.5)

## 2011-08-08 LAB — SURGICAL PCR SCREEN
MRSA, PCR: NEGATIVE
Staphylococcus aureus: NEGATIVE

## 2011-08-08 NOTE — Patient Instructions (Addendum)
20 Cordai Rodrigue  08/08/2011   Your procedure is scheduled on:  Thursday 08/10/2011 at 1100am  Report to [facility] at  0900 AM.  Call this number if you have problems the morning of surgery: [phone]   Remember:   Do not eat food:After Midnight.  May have clear liquids:until Midnight .  Clear liquids include soda, tea, black coffee, apple or grape juice, broth.  Take these medicines the morning of surgery with A SIP OF WATER: use Albuterol inhaler as needed and Flonase nasal spray if needed   Do not wear jewelry.  Do not wear lotions, powders.  Do not shave 48 hours prior to surgery.  Do not bring valuables to the hospital.  Contacts, dentures or bridgework may not be worn into surgery.  Leave suitcase in the car. After surgery it may be brought to your room.  For patients admitted to the hospital, checkout time is 11:00 AM the day of discharge.    Special Instructions: CHG Shower Use Special Wash: 1/2 bottle night before surgery and 1/2 bottle morning of surgery.   Please read over the following fact sheets that you were given: MRSA Information,sleep apnea,    Tomicka Lover pst phone number [phone]

## 2011-08-08 NOTE — Pre-Procedure Instructions (Signed)
Chest xray 06-28-11  [facility] on chart ekg 06-01-11 [facility] on chart

## 2011-08-09 NOTE — H&P (Signed)
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 He initially presented to me in February 2013 and was incidentally found to have very high PVRs of over 800cc. He subsequently developed urinary retention.  2) Prostate cancer screening: He has previously undergone PSA screening (always normal values) by Dr. Kevan Ny but has now elected to avoid PSA screening for prostate cancer.  Interval history:  Paul Daniels follows up today for further evaluation of his urinary retention. He is scheduled to undergo urodynamics and cystoscopy today. He developed urinary retention approximately 2 days after his most recent appointment when he was found to have a very elevated postvoid residual urine of 700 cc. This is despite chronic therapy with alpha blockers.   Past Medical History Problems  1. History of  Diabetes Mellitus 250.00 2. History of  Hypertension 401.9  Surgical History Problems  1. History of  Cholecystectomy 2. History of  Hernia Repair  Current Meds 1. ALPRAZolam 0.5 MG Oral Tablet; Therapy: 03Jan2013 to 2. AmLODIPine Besylate 5 MG Oral Tablet; takes 7.5mg ; Therapy: (Recorded:20Feb2013) to 3. Losartan Potassium-HCTZ 100-12.5 MG Oral Tablet; Therapy: 03Jan2013 to 4. MetFORMIN HCl 500 MG Oral Tablet; Therapy: 07May2012 to 5. ProAir HFA 108 (90 Base) MCG/ACT Inhalation Aerosol Solution; Therapy: 02Jan2013 to 6. Tamsulosin HCl 0.4 MG Oral Capsule; Therapy: 17Dec2012 to 7. Zolpidem Tartrate 10 MG Oral Tablet; Therapy: 04Jan2013 to  Allergies Medication  1. No Known Drug Allergies  Family History Problems  1. Paternal history of  Acute Myocardial Infarction V17.3 2. Maternal uncle's history of  Diabetes Mellitus V18.0 3. Maternal uncle's history of  Diabetes Mellitus V18.0  Social History Problems  1. Alcohol  Use 2-3 2. Former Smoker V15.82 somked socially when pt was younger 3. Marital History - Currently Married 4. Retired From Work retired Scientist, product/process development Vital Signs [Data Includes: Last 1 Day]  07Mar2013 03:05PM  Blood Pressure: 146 / 83 Heart Rate: 68  Physical Exam Constitutional: Well nourished and well developed . No acute distress.  ENT:. The ears and nose are normal in appearance.  Neck: The appearance of the neck is normal and no neck mass is present.  Pulmonary: No respiratory distress and normal respiratory rhythm and effort.  Cardiovascular:. No peripheral edema.  Genitourinary: Examination of the penis demonstrates no lesions and a normal meatus.  Skin: Normal skin turgor, no visible rash and no visible skin lesions.  Neuro/Psych:. Mood and affect are appropriate.    Results/Data  I reviewed his urodynamic study which is dictated separately. 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     Procedure  Procedure: Cystoscopy  Indication: Lower Urinary Tract Symptoms. Urinary retention.  Informed Consent: Risks, benefits, and potential adverse events were discussed and informed consent was obtained from the patient.  Premedication:. He was administered 4 mg of morphine at his request.  Prep: The patient was prepped with betadine.  Anesthesia:. Local anesthesia was administered intraurethrally with 2% lidocaine jelly.  Antibiotic prophylaxis: Ciprofloxacin.  Procedure Note:  Urethral meatus:. No abnormalities.  Anterior urethra: No abnormalities.  Prostatic urethra:. The lateral and median prostatic lobes were enlarged. An enlarged intravesical median lobe was visualized. His prostatic urethra was approximately 2.5 cm with a high bladder neck noted.  Bladder: Visulization was clear. The  ureteral orifices were in the normal anatomic position bilaterally and had clear efflux of urine. A  systematic survey of the bladder demonstrated no bladder tumors or stones. The mucosa was smooth without abnormalities. The patient tolerated the procedure well.   A new 16 French Foley catheter was placed after cystoscopy.  Complications: None.    Assessment Assessed  1. Acute Urinary Retention 788.20 2. Benign Localized Prostatic Hyperplasia 600.20  Plan Acute Urinary Retention (788.20)  1. Morphine Sulfate 15 MG/ML Injection Solution; INJECT 0.5  ML Intramuscular; Done: 07Mar2013  03:29PM; Status: COMPLETE 2. Follow-up Schedule Surgery Office  Follow-up  Done: 07Mar2013  Discussion/Summary 1. 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 Considering the evidence suggesting long-standing outlet obstruction with his evaluation today indicating multiple diverticuli and trabeculation, I have recommended proceeding with definitive surgical intervention with a transurethral resection of the prostate. We have had a detailed discussion reviewing the nature of this procedure and its indications as well as the potential risks and complications. These risks include but are not limited to bleeding, infection, risks of anesthesia, persistent urinary retention or voiding symptoms, need for further procedures, bladder neck contracture/stricture, erectile dysfunction, incontinence, and possible regrowth of prostate tissue, etc. We have discussed the expected postoperative recovery process. I recommended that he stop his aspirin one week prior to treatment. He will receive broad-spectrum prophylactic antibiotics considering his indwelling catheter.  Of note, he has not wished to undergo PSA screening for prostate cancer.  Cc: Dr. Johnella Moloney   a total of 40 minutes were spent in the overall care of the patient today Amended By: Heloise Purpura; 08/03/2011 5:51 PMEST with 25 minutes  in direct face to face consultation. Amended By: Heloise Purpura; 08/03/2011 5:51 PMEST    Verified Results URINE CULTURE1 07Mar2013 16:10RU0 Lyda Perone Source : CATHED SPECIMEN TYPE: CATH URINE [Aug 06, 2011 9:02PM Paul Daniels] Please call patient with culture result and instruct him to begin ciprofloxicin 3 days prior to his surgery date. This has been called in to his pharmacy.  Test Name Result Flag Reference CULTURE, URINE1 Culture, Urine1   ===== COLONY COUNT: =====  10,000 COLONIES/ML   FINAL REPORT: STAPHYLOCOCCUS SPECIES (COAGULASE NEGATIVE)  Rifampin and Gentamicin should not be used as single drugs for treatment of Staph infections.   Sensitivity for: STAPHYLOCOCCUS SPECIES (COAGULASE NEGATIVE)    PENICILLIN                             Sensitive     <=0.03    OXACILLIN                              Sensitive     <=0.25    CEFAZOLIN                              Sensitive               GENTAMICIN                             Sensitive      <=0.5    CIPROFLOXACIN  Sensitive      <=0.5    LEVOFLOXACIN                           Sensitive       0.25    NITROFURANTOIN                         Sensitive         32    VANCOMYCIN                             Sensitive      <=0.5    RIFAMPIN                               Sensitive      <=0.5    TETRACYCLINE                           Sensitive        <=1    END OF REPORT    1. Amended By: Heloise Purpura; 08/06/2011 9:02 PMEST  Signatures Electronically signed by : Heloise Purpura, M.D.; Aug 06 2011  9:02PM

## 2011-08-10 ENCOUNTER — Encounter (HOSPITAL_COMMUNITY)
Admission: RE | Disposition: A | Discharge status: Home / Self Care | Payer: Self-pay | Source: Ambulatory Visit | Attending: Urology

## 2011-08-10 ENCOUNTER — Ambulatory Visit (HOSPITAL_COMMUNITY): Payer: Medicare Other | Admitting: Anesthesiology

## 2011-08-10 ENCOUNTER — Ambulatory Visit (HOSPITAL_COMMUNITY)
Admission: RE | Admit: 2011-08-10 | Discharge: 2011-08-11 | Disposition: A | Discharge status: Home / Self Care | Payer: Medicare Other | Source: Ambulatory Visit | Attending: Urology | Admitting: Urology

## 2011-08-10 ENCOUNTER — Encounter (HOSPITAL_COMMUNITY): Payer: Self-pay | Admitting: Anesthesiology

## 2011-08-10 ENCOUNTER — Encounter (HOSPITAL_COMMUNITY): Payer: Self-pay

## 2011-08-10 DIAGNOSIS — N138 Other obstructive and reflux uropathy: Secondary | ICD-10-CM | POA: Insufficient documentation

## 2011-08-10 DIAGNOSIS — Z01812 Encounter for preprocedural laboratory examination: Secondary | ICD-10-CM | POA: Insufficient documentation

## 2011-08-10 DIAGNOSIS — I10 Essential (primary) hypertension: Secondary | ICD-10-CM | POA: Insufficient documentation

## 2011-08-10 DIAGNOSIS — Z87898 Personal history of other specified conditions: Secondary | ICD-10-CM

## 2011-08-10 DIAGNOSIS — R339 Retention of urine, unspecified: Secondary | ICD-10-CM | POA: Insufficient documentation

## 2011-08-10 DIAGNOSIS — N401 Enlarged prostate with lower urinary tract symptoms: Secondary | ICD-10-CM | POA: Insufficient documentation

## 2011-08-10 DIAGNOSIS — E119 Type 2 diabetes mellitus without complications: Secondary | ICD-10-CM | POA: Insufficient documentation

## 2011-08-10 DIAGNOSIS — N320 Bladder-neck obstruction: Secondary | ICD-10-CM | POA: Insufficient documentation

## 2011-08-10 DIAGNOSIS — N323 Diverticulum of bladder: Secondary | ICD-10-CM | POA: Insufficient documentation

## 2011-08-10 HISTORY — PX: CYSTOSCOPY: SHX5120

## 2011-08-10 HISTORY — PX: TRANSURETHRAL RESECTION OF PROSTATE: SHX73

## 2011-08-10 LAB — BASIC METABOLIC PANEL
Chloride: 104 mEq/L (ref 96–112)
Creatinine, Ser: 0.92 mg/dL (ref 0.50–1.35)
GFR calc Af Amer: >90 mL/min (ref >90)
Potassium: 3.9 mEq/L (ref 3.5–5.1)
Sodium: 138 mEq/L (ref 135–145)

## 2011-08-10 LAB — GLUCOSE, CAPILLARY
Glucose-Capillary: 142 mg/dL — ABNORMAL HIGH (ref 70–99)
Glucose-Capillary: 109 mg/dL — ABNORMAL HIGH (ref 70–99)

## 2011-08-10 SURGERY — TURP (TRANSURETHRAL RESECTION OF PROSTATE)
Anesthesia: Spinal | Site: Prostate | Wound class: Clean Contaminated

## 2011-08-10 MED ORDER — FENTANYL CITRATE 0.05 MG/ML IJ SOLN
INTRAMUSCULAR | Status: DC | PRN
  Administered 2011-08-10: 100 ug via INTRAVENOUS
  Administered 2011-08-10: 50 ug via INTRAVENOUS

## 2011-08-10 MED ORDER — KETAMINE HCL 10 MG/ML IJ SOLN
INTRAMUSCULAR | Status: DC | PRN
  Administered 2011-08-10 (×2): 10 mg via INTRAVENOUS

## 2011-08-10 MED ORDER — LACTATED RINGERS IV SOLN
INTRAVENOUS | Status: DC
  Administered 2011-08-10: 1000 mL via INTRAVENOUS
  Administered 2011-08-10: 12:00:00 via INTRAVENOUS

## 2011-08-10 MED ORDER — FLUTICASONE PROPIONATE 50 MCG/ACT NA SUSP
2.0000 | Freq: Every day | NASAL | Status: DC | PRN
  Filled 2011-08-10: qty 16

## 2011-08-10 MED ORDER — INSULIN ASPART 100 UNIT/ML ~~LOC~~ SOLN: 0.0000 [IU] | SUBCUTANEOUS | Status: DC

## 2011-08-10 MED ORDER — HYDROCHLOROTHIAZIDE 12.5 MG PO CAPS
12.5000 mg | ORAL_CAPSULE | Freq: Every day | ORAL | Status: DC
  Administered 2011-08-10 – 2011-08-11 (×2): 12.5 mg via ORAL
  Filled 2011-08-10 (×2): qty 1

## 2011-08-10 MED ORDER — SODIUM CHLORIDE 0.9 % IR SOLN
Status: DC | PRN
  Administered 2011-08-10: 15000 mL

## 2011-08-10 MED ORDER — CIPROFLOXACIN IN D5W 400 MG/200ML IV SOLN
400.0000 mg | Freq: Two times a day (BID) | INTRAVENOUS | Status: DC
  Administered 2011-08-10: 400 mg via INTRAVENOUS
  Filled 2011-08-10 (×2): qty 200

## 2011-08-10 MED ORDER — OXYCODONE HCL 5 MG PO TABS
5.0000 mg | ORAL_TABLET | ORAL | Status: DC | PRN
  Administered 2011-08-10 – 2011-08-11 (×3): 5 mg via ORAL
  Filled 2011-08-10 (×3): qty 1

## 2011-08-10 MED ORDER — HYDROMORPHONE HCL PF 1 MG/ML IJ SOLN: 0.2500 mg | INTRAMUSCULAR | Status: DC | PRN

## 2011-08-10 MED ORDER — PROPOFOL 10 MG/ML IV BOLUS
INTRAVENOUS | Status: DC | PRN
  Administered 2011-08-10: 10 mg via INTRAVENOUS
  Administered 2011-08-10: 20 mg via INTRAVENOUS

## 2011-08-10 MED ORDER — ALBUTEROL SULFATE HFA 108 (90 BASE) MCG/ACT IN AERS
1.0000 | INHALATION_SPRAY | RESPIRATORY_TRACT | Status: DC | PRN
  Filled 2011-08-10: qty 6.7

## 2011-08-10 MED ORDER — HYDROCODONE-ACETAMINOPHEN 5-500 MG PO CAPS: 1.0000 | ORAL_CAPSULE | Freq: Four times a day (QID) | ORAL | Status: AC | PRN

## 2011-08-10 MED ORDER — POTASSIUM CHLORIDE CRYS ER 20 MEQ PO TBCR
20.0000 meq | EXTENDED_RELEASE_TABLET | Freq: Every evening | ORAL | Status: DC
  Administered 2011-08-10: 20 meq via ORAL
  Filled 2011-08-10 (×2): qty 1

## 2011-08-10 MED ORDER — SODIUM CHLORIDE 0.9 % IV SOLN
1.5000 g | INTRAVENOUS | Status: AC
  Administered 2011-08-10: 1.5 g via INTRAVENOUS

## 2011-08-10 MED ORDER — CIPROFLOXACIN IN D5W 400 MG/200ML IV SOLN
400.0000 mg | INTRAVENOUS | Status: AC
  Administered 2011-08-10: 400 mg via INTRAVENOUS

## 2011-08-10 MED ORDER — ZOLPIDEM TARTRATE 5 MG PO TABS
5.0000 mg | ORAL_TABLET | Freq: Every evening | ORAL | Status: DC | PRN
  Administered 2011-08-10: 5 mg via ORAL
  Filled 2011-08-10: qty 1

## 2011-08-10 MED ORDER — SODIUM CHLORIDE 0.9 % IV SOLN: 250.0000 mL | INTRAVENOUS | Status: DC | PRN

## 2011-08-10 MED ORDER — LOSARTAN POTASSIUM 50 MG PO TABS
100.0000 mg | ORAL_TABLET | Freq: Every day | ORAL | Status: DC
  Administered 2011-08-10 – 2011-08-11 (×2): 100 mg via ORAL
  Filled 2011-08-10 (×2): qty 2

## 2011-08-10 MED ORDER — BACITRACIN-NEOMYCIN-POLYMYXIN 400-5-5000 EX OINT: 1.0000 "application " | TOPICAL_OINTMENT | Freq: Three times a day (TID) | CUTANEOUS | Status: DC | PRN

## 2011-08-10 MED ORDER — DOCUSATE SODIUM 100 MG PO CAPS: 100.0000 mg | ORAL_CAPSULE | Freq: Two times a day (BID) | ORAL | Status: AC

## 2011-08-10 MED ORDER — PROPOFOL 10 MG/ML IV EMUL
INTRAVENOUS | Status: DC | PRN
  Administered 2011-08-10: 25 ug/kg/min via INTRAVENOUS

## 2011-08-10 MED ORDER — MIDAZOLAM HCL 5 MG/5ML IJ SOLN
INTRAMUSCULAR | Status: DC | PRN
  Administered 2011-08-10 (×2): 1 mg via INTRAVENOUS
  Administered 2011-08-10: 2 mg via INTRAVENOUS
  Administered 2011-08-10: 1 mg via INTRAVENOUS

## 2011-08-10 MED ORDER — LOSARTAN POTASSIUM-HCTZ 100-12.5 MG PO TABS
1.0000 | ORAL_TABLET | Freq: Every morning | ORAL | Status: DC
Start: 2011-08-10 — End: 2011-08-10

## 2011-08-10 MED ORDER — DOCUSATE SODIUM 100 MG PO CAPS
100.0000 mg | ORAL_CAPSULE | Freq: Two times a day (BID) | ORAL | Status: DC
  Administered 2011-08-10 – 2011-08-11 (×3): 100 mg via ORAL
  Filled 2011-08-10 (×5): qty 1

## 2011-08-10 MED ORDER — DIPHENHYDRAMINE HCL 12.5 MG/5ML PO ELIX: 12.5000 mg | ORAL_SOLUTION | Freq: Four times a day (QID) | ORAL | Status: DC | PRN

## 2011-08-10 MED ORDER — HYDROCODONE-ACETAMINOPHEN 5-325 MG PO TABS: 1.0000 | ORAL_TABLET | ORAL | Status: DC | PRN

## 2011-08-10 MED ORDER — SODIUM CHLORIDE 0.9 % IJ SOLN: 3.0000 mL | INTRAMUSCULAR | Status: DC | PRN

## 2011-08-10 MED ORDER — ALPRAZOLAM 0.5 MG PO TABS
0.5000 mg | ORAL_TABLET | Freq: Every evening | ORAL | Status: DC | PRN
  Administered 2011-08-10: 0.75 mg via ORAL
  Filled 2011-08-10: qty 2

## 2011-08-10 MED ORDER — OXYCODONE HCL 5 MG PO TABS: 5.0000 mg | ORAL_TABLET | ORAL | Status: AC | PRN

## 2011-08-10 MED ORDER — AMLODIPINE BESYLATE 5 MG PO TABS
7.5000 mg | ORAL_TABLET | Freq: Every evening | ORAL | Status: DC
  Administered 2011-08-10: 7.5 mg via ORAL
  Filled 2011-08-10 (×2): qty 1

## 2011-08-10 MED ORDER — CIPROFLOXACIN HCL 500 MG PO TABS: 500.0000 mg | ORAL_TABLET | Freq: Two times a day (BID) | ORAL | Status: AC

## 2011-08-10 MED ORDER — SODIUM CHLORIDE 0.45 % IV SOLN
INTRAVENOUS | Status: DC
  Administered 2011-08-10 – 2011-08-11 (×2): via INTRAVENOUS

## 2011-08-10 MED ORDER — DIPHENHYDRAMINE HCL 50 MG/ML IJ SOLN: 12.5000 mg | Freq: Four times a day (QID) | INTRAMUSCULAR | Status: DC | PRN

## 2011-08-10 MED ORDER — BUPIVACAINE IN DEXTROSE 0.75-8.25 % IT SOLN
INTRATHECAL | Status: DC | PRN
  Administered 2011-08-10: 10 mg via INTRATHECAL

## 2011-08-10 MED ORDER — INSULIN ASPART 100 UNIT/ML ~~LOC~~ SOLN
0.0000 [IU] | Freq: Three times a day (TID) | SUBCUTANEOUS | Status: DC
  Administered 2011-08-11: 2 [IU] via SUBCUTANEOUS

## 2011-08-10 MED ORDER — SODIUM CHLORIDE 0.9 % IJ SOLN: 3.0000 mL | Freq: Two times a day (BID) | INTRAMUSCULAR | Status: DC

## 2011-08-10 MED ORDER — SODIUM CHLORIDE 0.9 % IV SOLN
INTRAVENOUS | Status: AC
  Filled 2011-08-10: qty 1.5

## 2011-08-10 MED ORDER — CIPROFLOXACIN IN D5W 400 MG/200ML IV SOLN
INTRAVENOUS | Status: AC
  Filled 2011-08-10: qty 200

## 2011-08-10 SURGICAL SUPPLY — 16 items
BAG URINE DRAINAGE (UROLOGICAL SUPPLIES) ×2 IMPLANT
BAG URO CATCHER STRL LF (DRAPE) ×4 IMPLANT
CATH HEMA 3WAY 30CC 22FR COUDE (CATHETERS) ×2 IMPLANT
CLOTH BEACON ORANGE TIMEOUT ST (SAFETY) ×4 IMPLANT
DRAPE CAMERA CLOSED 9X96 (DRAPES) ×4 IMPLANT
ELECT REM PT RETURN 9FT ADLT (ELECTROSURGICAL) ×4
ELECTRODE REM PT RTRN 9FT ADLT (ELECTROSURGICAL) ×3 IMPLANT
EVACUATOR MICROVAS BLADDER (UROLOGICAL SUPPLIES) IMPLANT
GLOVE BIOGEL M STRL SZ7.5 (GLOVE) ×4 IMPLANT
GOWN STRL NON-REIN LRG LVL3 (GOWN DISPOSABLE) ×8 IMPLANT
KIT ASPIRATION TUBING (SET/KITS/TRAYS/PACK) ×2 IMPLANT
LOOPS RESECTOSCOPE DISP (ELECTROSURGICAL) ×2 IMPLANT
MANIFOLD NEPTUNE II (INSTRUMENTS) ×4 IMPLANT
PACK CYSTO (CUSTOM PROCEDURE TRAY) ×4 IMPLANT
SYRINGE IRR TOOMEY STRL 70CC (SYRINGE) ×2 IMPLANT
TUBING CONNECTING 10 (TUBING) ×4 IMPLANT

## 2011-08-10 NOTE — Discharge Instructions (Signed)
1. You may see some blood in the urine and may have some burning with urination for 48-72 hours. You also may notice that you have to urinate more frequently or urgently after your procedure which is normal.  2. You should call should you develop an inability urinate, fever > 101, persistent nausea and vomiting that prevents you from eating or drinking to stay hydrated.  3. If you have a catheter, you will be taught how to take care of the catheter by the nursing staff prior to discharge from the hospital.  You may periodically feel a strong urge to void with the catheter in place.  This is a bladder spasm and most often can occur when having a bowel movement or moving around. It is typically self-limited and usually will stop after a few minutes.  You may use some Vaseline or Neosporin around the tip of the catheter to reduce friction at the tip of the penis. You may also see some blood in the urine.  A very small amount of blood can make the urine look quite red.  As long as the catheter is draining well, there usually is not a problem.  However, if the catheter is not draining well and is bloody, you should call the office [phone]) to notify us.   Hold vitamins and supplements and any aspirin or anti-inflammatory medications for 1 week or until urine is clear of any blood.  Refrain from lifting > 15 lbs or straining for 3 weeks.

## 2011-08-10 NOTE — Anesthesia Preprocedure Evaluation (Addendum)
Anesthesia Evaluation  Patient identified by MRN, date of birth, ID band Patient awake    Reviewed: Allergy & Precautions, H&P , NPO status , Patient's Chart, lab work & pertinent test results, reviewed documented beta blocker date and time   Airway Mallampati: II TM Distance: >3 FB Neck ROM: Full    Dental  (+) Dental Advisory Given   Pulmonary sleep apnea ,  Sleep study, CPAP recommended, non-compliant w/ CPAP breath sounds clear to auscultation        Cardiovascular hypertension, Pt. on medications Rhythm:Regular Rate:Normal  Denies cardiac symptoms   Neuro/Psych negative neurological ROS  negative psych ROS   GI/Hepatic negative GI ROS, Neg liver ROS,   Endo/Other  Diabetes mellitus-, Well Controlled, Type 2, Oral Hypoglycemic Agents  Renal/GU negative Renal ROS   Urinary retention/BPH    Musculoskeletal negative musculoskeletal ROS (+)   Abdominal   Peds negative pediatric ROS (+)  Hematology negative hematology ROS (+)   Anesthesia Other Findings Lost a filling last night, upper left  Reproductive/Obstetrics negative OB ROS                          Anesthesia Physical Anesthesia Plan  ASA: III  Anesthesia Plan: Spinal   Post-op Pain Management:    Induction: Intravenous  Airway Management Planned: Mask  Additional Equipment:   Intra-op Plan:   Post-operative Plan:   Informed Consent: I have reviewed the patients History and Physical, chart, labs and discussed the procedure including the risks, benefits and alternatives for the proposed anesthesia with the patient or authorized representative who has indicated his/her understanding and acceptance.   Dental advisory given  Plan Discussed with: CRNA and Surgeon  Anesthesia Plan Comments:         Anesthesia Quick Evaluation

## 2011-08-10 NOTE — Op Note (Signed)
Preoperative diagnosis: 1. Bladder outlet obstruction secondary to BPH 2. Urinary retention  Postoperative diagnosis:  1. Bladder outlet obstruction secondary to BPH 2. Urinary retention  Procedure:  1. Cystoscopy 2. Transurethral resection of the prostate  Surgeon: Moody Bruins. M.D.  Anesthesia: General  Complications: None  EBL: Minimal  Specimens: 1. Prostate Chips  Disposition of specimens: Pathology  Indication: Paul Daniels is a patient with bladder outlet obstruction secondary to benign prostatic hyperplasia. After reviewing the management options for treatment, he elected to proceed with the above surgical procedure(s). We have discussed the potential benefits and risks of the procedure, side effects of the proposed treatment, the likelihood of the patient achieving the goals of the procedure, and any potential problems that might occur during the procedure or recuperation. Informed consent has been obtained.  Description of procedure:  The patient was taken to the operating room and general anesthesia was induced.  The patient was placed in the dorsal lithotomy position, prepped and draped in the usual sterile fashion, and preoperative antibiotics were administered. A preoperative time-out was performed.   Cystourethroscopy was performed.  The patient's urethra was examined and demonstrated a 2.5 cm long prostatic urethra with bilobar prostatic hypertrophy with a median lobe.   The bladder was then systematically examined in its entirety. There was no evidence of any bladder tumors, stones, or other mucosal pathology. He was noted to have a large left sided diverticulum.  The ureteral orifices were identified and marked so as to be avoided during the procedure.  The prostate adenoma was then resected utilizing loop cautery resection with the monopolar/bipolar cutting loop.  The prostate adenoma from the bladder neck back to the verumontanum was resected  beginning at the six o'clock position and then extended to include the right and left lobes of the prostate and anterior prostate. Care was taken not to resect distal to the verumontanum.  Hemostasis was then achieved with the cautery and the bladder was emptied and reinspected with no significant bleeding noted at the end of the procedure.    A 3 way catheter was then placed into the bladder and placed on continuous bladder irrigation.  The patient appeared to tolerate the procedure well and without complications.  The patient was able to be awakened and transferred to the recovery unit in satisfactory condition.

## 2011-08-10 NOTE — Anesthesia Postprocedure Evaluation (Signed)
  Anesthesia Post-op Note  Patient: Paul Daniels  Procedure(s) Performed: Procedure(s) (LRB): CYSTOSCOPY (N/A) TRANSURETHRAL RESECTION OF THE PROSTATE (TURP) (N/A)  Patient Location: PACU  Anesthesia Type: Spinal  Level of Consciousness: oriented and sedated  Airway and Oxygen Therapy: Patient Spontanous Breathing and Patient connected to nasal cannula oxygen  Post-op Pain: none  Post-op Assessment: Post-op Vital signs reviewed, Patient's Cardiovascular Status Stable, Respiratory Function Stable and Patent Airway  Post-op Vital Signs: stable  Complications: No apparent anesthesia complications

## 2011-08-10 NOTE — Anesthesia Procedure Notes (Signed)
Spinal  Patient location during procedure: OR Start time: 08/10/2011 11:03 AM End time: 08/10/2011 11:13 AM Staffing Anesthesiologist: Lestine Box B Performed by: anesthesiologist  Preanesthetic Checklist Completed: patient identified, site marked, surgical consent, pre-op evaluation, timeout performed, IV checked, risks and benefits discussed and monitors and equipment checked Spinal Block Patient position: sitting Prep: Betadine and site prepped and draped Patient monitoring: heart rate, cardiac monitor, continuous pulse ox and blood pressure Approach: right paramedian Location: L3-4 Injection technique: single-shot Needle Needle type: Quincke  Needle gauge: 25 G Needle length: 9 cm Needle insertion depth: 3 cm Assessment Sensory level: T6 Additional Notes [id number], 2014-06

## 2011-08-10 NOTE — Progress Notes (Signed)
B met still pending

## 2011-08-10 NOTE — Interval H&P Note (Signed)
History and Physical Interval Note:  08/10/2011 10:21 AM  Paul Daniels  has presented today for surgery, with the diagnosis of urine retention  The various methods of treatment have been discussed with the patient and family. After consideration of risks, benefits and other options for treatment, the patient has consented to  Procedure(s) (LRB): TRANSURETHRAL RESECTION OF PROSTATE CYSTOSCOPY (N/A) as a surgical intervention .  The patients' history has been reviewed, patient examined, no change in status, stable for surgery.  Questions were answered to the patient's satisfaction.     Raven Harmes,LES

## 2011-08-10 NOTE — Progress Notes (Signed)
B met drawn by lab. 

## 2011-08-10 NOTE — Transfer of Care (Signed)
Immediate Anesthesia Transfer of Care Note  Patient: Paul Daniels  Procedure(s) Performed: Procedure(s) (LRB): CYSTOSCOPY (N/A) TRANSURETHRAL RESECTION OF THE PROSTATE (TURP) (N/A)  Patient Location: PACU  Anesthesia Type: MAC and Spinal  Level of Consciousness: awake, oriented, sedated and patient cooperative  Airway & Oxygen Therapy: Patient Spontanous Breathing and Patient connected to face mask oxygen  Post-op Assessment: Report given to PACU RN, Post -op Vital signs reviewed and stable and SAB level L1  Post vital signs: Reviewed and stable  Complications: No apparent anesthesia complications

## 2011-08-11 LAB — BASIC METABOLIC PANEL
CO2: 29 mEq/L (ref 19–32)
Calcium: 9.1 mg/dL (ref 8.4–10.5)
Creatinine, Ser: 0.96 mg/dL (ref 0.50–1.35)

## 2011-08-11 LAB — GLUCOSE, CAPILLARY: Glucose-Capillary: 162 mg/dL — ABNORMAL HIGH (ref 70–99)

## 2011-08-11 NOTE — Discharge Summary (Signed)
Date of admission: 08/10/2011  Date of discharge: 08/11/2011  Admission diagnosis: urinary retention secondary to BPH  Discharge diagnosis: urinary retention secondary to BPH  Secondary diagnoses: diabetes  History and Physical: For full details, please see admission history and physical. Briefly, Paul Daniels is a 66 y.o. year old patient with urinary retention secondary to BPH despite alpha-blocker therapy. He underwent evaluation including cystoscopy and urodynamic symptoms were consistent with obstruction related to an enlarged prostate. After reviewing options for treatment, he elected to proceed with transurethral resection of the prostate.Marland Kitchen   Hospital Course: on 08/10/11, he was taken to the operating room and underwent a transurethral resection of the prostate. He tolerated the procedure well without complications. Postoperatively, he was able to be transferred to a regular hospital room following recovery from anesthesia. He was maintained on continuous bladder irrigated with saline overnight. His urine was clear following titration of his continuous bladder irrigation and his catheter was able to be removed on the morning of postoperative day one. He passed his voiding trial and was able to be discharged home in good condition.  Laboratory values: No results found for this basename: HGB:3,HCT:3 in the last 72 hours  Basename 08/11/11 0743 08/10/11 1244  CREATININE 0.96 0.92    Disposition: Home  Discharge instruction: The patient was instructed to be ambulatory but told to refrain from heavy lifting, strenuous activity, or driving.  Discharge medications:  Despite the list below which was generated automatically by the electronic medical record, he only has been discharged home on prescription of ciprofloxacin, Colace, and oxycodone.  Medication List  As of 08/11/2011 11:08 PM   START taking these medications         * ciprofloxacin 500 MG tablet   Commonly known as: CIPRO   Take 1 tablet (500 mg total) by mouth 2 (two) times daily.      * ciprofloxacin 500 MG tablet   Commonly known as: CIPRO   Take 1 tablet (500 mg total) by mouth 2 (two) times daily.      * docusate sodium 100 MG capsule   Commonly known as: COLACE   Take 1 capsule (100 mg total) by mouth 2 (two) times daily.      * docusate sodium 100 MG capsule   Commonly known as: COLACE   Take 1 capsule (100 mg total) by mouth 2 (two) times daily.      * hydrocodone-acetaminophen 5-500 MG per capsule   Commonly known as: LORCET-HD   Take 1 capsule by mouth every 6 (six) hours as needed for pain.      * hydrocodone-acetaminophen 5-500 MG per capsule   Commonly known as: LORCET-HD   Take 1 capsule by mouth every 6 (six) hours as needed for pain.      oxyCODONE 5 MG immediate release tablet   Commonly known as: Oxy IR/ROXICODONE   Take 1 tablet (5 mg total) by mouth every 4 (four) hours as needed for pain.     * Notice: This list has 6 medication(s) that are the same as other medications prescribed for you. Read the directions carefully, and ask your doctor or other care provider to review them with you.       CONTINUE taking these medications         albuterol 108 (90 BASE) MCG/ACT inhaler   Commonly known as: PROVENTIL HFA;VENTOLIN HFA      ALPRAZolam 0.5 MG tablet   Commonly known as: XANAX      amLODipine  5 MG tablet   Commonly known as: NORVASC      fluticasone 50 MCG/ACT nasal spray   Commonly known as: FLONASE      KLOR-CON M20 20 MEQ tablet   Generic drug: potassium chloride SA      losartan-hydrochlorothiazide 100-12.5 MG per tablet   Commonly known as: HYZAAR      metFORMIN 500 MG (MOD) 24 hr tablet   Commonly known as: GLUMETZA      zolpidem 10 MG tablet   Commonly known as: AMBIEN         STOP taking these medications         Co Q-10 120 MG Caps      fish oil-omega-3 fatty acids 1000 MG capsule      multivitamins ther. w/minerals Tabs      Tamsulosin HCl  0.4 MG Caps          Where to get your medications    These are the prescriptions that you need to pick up.   You may get these medications from any pharmacy.         ciprofloxacin 500 MG tablet   docusate sodium 100 MG capsule   hydrocodone-acetaminophen 5-500 MG per capsule   oxyCODONE 5 MG immediate release tablet            Followup:  Follow-up Information    Please follow up. (09/05/11 at 10:30)

## 2011-08-11 NOTE — Progress Notes (Signed)
Patient ID: Paul Daniels, male   DOB: [birth year], 66 y.o.   MRN: [id number]  1 Day Post-Op Subjective: Pt without complaints except for irritation and pain around catheter at tip of penis.  On slow CBI drip currently.  Objective: Vital signs in last 24 hours: Temp:  [96.8 F (36 C)-98.5 F (36.9 C)] 98.5 F (36.9 C) (03/15 0540) Pulse Rate:  [50-67] 64  (03/15 0540) Resp:  [11-20] 16  (03/15 0540) BP: (112-150)/(68-89) 135/89 mmHg (03/15 0540) SpO2:  [95 %-100 %] 95 % (03/15 0540) Weight:  [89.812 kg (198 lb)] 89.812 kg (198 lb) (03/14 1335)  Intake/Output from previous day: 03/14 0701 - 03/15 0700 In: 1640 [P.O.:240; I.V.:1300; IV Piggyback:100] Out: -5575  Intake/Output this shift: Total I/O In: 1287.5 [I.V.:1287.5] Out: -   Physical Exam:  General: Alert and oriented Abdomen: Soft, ND GU: Urine clear on minimal drip  Lab Results:  Basename 08/08/11 1355  HGB 14.5  HCT 40.2   BMET  Basename 08/10/11 1244 08/08/11 1355  NA 138 138  K 3.9 3.4*  CL 104 99  CO2 28 28  GLUCOSE 145* 127*  BUN 15 14  CREATININE 0.92 0.97  CALCIUM 8.7 9.8     Studies/Results: No results found.  Assessment/Plan: 1) Check BMP 2) Voiding trial   LOS: 1 day   Madoc Holquin,LES 08/11/2011, 8:34 AM

## 2011-08-11 NOTE — Progress Notes (Signed)
Pt sat in chair for 2 hours and walked in room about 20 feet. Pt felt sensitivity d/t Foley when walking.  Paul Daniels, [name]

## 2011-08-23 ENCOUNTER — Encounter (HOSPITAL_COMMUNITY): Payer: Self-pay | Admitting: Urology

## 2011-09-05 DIAGNOSIS — N401 Enlarged prostate with lower urinary tract symptoms: Secondary | ICD-10-CM | POA: Diagnosis not present

## 2011-09-05 DIAGNOSIS — R339 Retention of urine, unspecified: Secondary | ICD-10-CM | POA: Diagnosis not present

## 2011-09-06 DIAGNOSIS — E119 Type 2 diabetes mellitus without complications: Secondary | ICD-10-CM | POA: Diagnosis not present

## 2011-09-15 DIAGNOSIS — H2510 Age-related nuclear cataract, unspecified eye: Secondary | ICD-10-CM | POA: Diagnosis not present

## 2011-09-15 DIAGNOSIS — E119 Type 2 diabetes mellitus without complications: Secondary | ICD-10-CM | POA: Diagnosis not present

## 2011-12-06 DIAGNOSIS — E7800 Pure hypercholesterolemia, unspecified: Secondary | ICD-10-CM | POA: Diagnosis not present

## 2011-12-06 DIAGNOSIS — I10 Essential (primary) hypertension: Secondary | ICD-10-CM | POA: Diagnosis not present

## 2011-12-06 DIAGNOSIS — Z1331 Encounter for screening for depression: Secondary | ICD-10-CM | POA: Diagnosis not present

## 2011-12-06 DIAGNOSIS — E119 Type 2 diabetes mellitus without complications: Secondary | ICD-10-CM | POA: Diagnosis not present

## 2011-12-28 DIAGNOSIS — E876 Hypokalemia: Secondary | ICD-10-CM | POA: Diagnosis not present

## 2011-12-28 DIAGNOSIS — L821 Other seborrheic keratosis: Secondary | ICD-10-CM | POA: Diagnosis not present

## 2011-12-28 DIAGNOSIS — D235 Other benign neoplasm of skin of trunk: Secondary | ICD-10-CM | POA: Diagnosis not present

## 2011-12-28 DIAGNOSIS — L570 Actinic keratosis: Secondary | ICD-10-CM | POA: Diagnosis not present

## 2012-01-12 DIAGNOSIS — N401 Enlarged prostate with lower urinary tract symptoms: Secondary | ICD-10-CM | POA: Diagnosis not present

## 2012-02-02 DIAGNOSIS — I10 Essential (primary) hypertension: Secondary | ICD-10-CM | POA: Diagnosis not present

## 2012-02-23 DIAGNOSIS — Z23 Encounter for immunization: Secondary | ICD-10-CM | POA: Diagnosis not present

## 2012-02-23 DIAGNOSIS — N401 Enlarged prostate with lower urinary tract symptoms: Secondary | ICD-10-CM | POA: Diagnosis not present

## 2012-03-28 DIAGNOSIS — E119 Type 2 diabetes mellitus without complications: Secondary | ICD-10-CM | POA: Diagnosis not present

## 2012-05-06 DIAGNOSIS — M19049 Primary osteoarthritis, unspecified hand: Secondary | ICD-10-CM | POA: Diagnosis not present

## 2012-05-06 DIAGNOSIS — D492 Neoplasm of unspecified behavior of bone, soft tissue, and skin: Secondary | ICD-10-CM | POA: Diagnosis not present

## 2012-05-06 DIAGNOSIS — L820 Inflamed seborrheic keratosis: Secondary | ICD-10-CM | POA: Diagnosis not present

## 2012-05-06 DIAGNOSIS — L570 Actinic keratosis: Secondary | ICD-10-CM | POA: Diagnosis not present

## 2012-06-11 DIAGNOSIS — Z79899 Other long term (current) drug therapy: Secondary | ICD-10-CM | POA: Diagnosis not present

## 2012-06-11 DIAGNOSIS — E782 Mixed hyperlipidemia: Secondary | ICD-10-CM | POA: Diagnosis not present

## 2012-06-11 DIAGNOSIS — E119 Type 2 diabetes mellitus without complications: Secondary | ICD-10-CM | POA: Diagnosis not present

## 2012-06-11 DIAGNOSIS — I10 Essential (primary) hypertension: Secondary | ICD-10-CM | POA: Diagnosis not present

## 2012-06-11 DIAGNOSIS — Z0000 Encounter for general adult medical examination without abnormal findings: Secondary | ICD-10-CM | POA: Diagnosis not present

## 2012-06-17 DIAGNOSIS — E119 Type 2 diabetes mellitus without complications: Secondary | ICD-10-CM | POA: Diagnosis not present

## 2012-06-17 DIAGNOSIS — Z79899 Other long term (current) drug therapy: Secondary | ICD-10-CM | POA: Diagnosis not present

## 2012-06-17 DIAGNOSIS — I10 Essential (primary) hypertension: Secondary | ICD-10-CM | POA: Diagnosis not present

## 2012-06-17 DIAGNOSIS — Z0000 Encounter for general adult medical examination without abnormal findings: Secondary | ICD-10-CM | POA: Diagnosis not present

## 2012-06-17 DIAGNOSIS — K5289 Other specified noninfective gastroenteritis and colitis: Secondary | ICD-10-CM | POA: Diagnosis not present

## 2012-06-17 DIAGNOSIS — G4700 Insomnia, unspecified: Secondary | ICD-10-CM | POA: Diagnosis not present

## 2012-06-17 DIAGNOSIS — E782 Mixed hyperlipidemia: Secondary | ICD-10-CM | POA: Diagnosis not present

## 2012-06-17 DIAGNOSIS — N400 Enlarged prostate without lower urinary tract symptoms: Secondary | ICD-10-CM | POA: Diagnosis not present

## 2012-07-13 DIAGNOSIS — S069X0A Unspecified intracranial injury without loss of consciousness, initial encounter: Secondary | ICD-10-CM | POA: Diagnosis not present

## 2012-07-13 DIAGNOSIS — F0781 Postconcussional syndrome: Secondary | ICD-10-CM | POA: Diagnosis not present

## 2012-07-25 DIAGNOSIS — D485 Neoplasm of uncertain behavior of skin: Secondary | ICD-10-CM | POA: Diagnosis not present

## 2012-08-05 DIAGNOSIS — E782 Mixed hyperlipidemia: Secondary | ICD-10-CM | POA: Diagnosis not present

## 2012-08-05 DIAGNOSIS — G4700 Insomnia, unspecified: Secondary | ICD-10-CM | POA: Diagnosis not present

## 2012-08-05 DIAGNOSIS — Z733 Stress, not elsewhere classified: Secondary | ICD-10-CM | POA: Diagnosis not present

## 2012-08-09 ENCOUNTER — Other Ambulatory Visit: Payer: Self-pay | Admitting: Ophthalmology

## 2012-08-09 DIAGNOSIS — D485 Neoplasm of uncertain behavior of skin: Secondary | ICD-10-CM | POA: Diagnosis not present

## 2012-08-09 DIAGNOSIS — L820 Inflamed seborrheic keratosis: Secondary | ICD-10-CM | POA: Diagnosis not present

## 2012-10-08 DIAGNOSIS — Z79899 Other long term (current) drug therapy: Secondary | ICD-10-CM | POA: Diagnosis not present

## 2012-10-08 DIAGNOSIS — E7800 Pure hypercholesterolemia, unspecified: Secondary | ICD-10-CM | POA: Diagnosis not present

## 2013-01-07 DIAGNOSIS — E119 Type 2 diabetes mellitus without complications: Secondary | ICD-10-CM | POA: Diagnosis not present

## 2013-01-23 DIAGNOSIS — E119 Type 2 diabetes mellitus without complications: Secondary | ICD-10-CM | POA: Diagnosis not present

## 2013-02-10 ENCOUNTER — Other Ambulatory Visit: Payer: Self-pay | Admitting: Gastroenterology

## 2013-02-19 ENCOUNTER — Encounter (HOSPITAL_COMMUNITY): Payer: Self-pay | Admitting: Pharmacy Technician

## 2013-02-19 ENCOUNTER — Encounter (HOSPITAL_COMMUNITY): Payer: Self-pay | Admitting: *Deleted

## 2013-03-18 ENCOUNTER — Encounter (HOSPITAL_COMMUNITY)
Admission: RE | Disposition: A | Discharge status: Home / Self Care | Payer: Self-pay | Source: Ambulatory Visit | Attending: Gastroenterology

## 2013-03-18 ENCOUNTER — Ambulatory Visit (HOSPITAL_COMMUNITY): Payer: Medicare Other | Admitting: *Deleted

## 2013-03-18 ENCOUNTER — Encounter (HOSPITAL_COMMUNITY): Payer: Self-pay | Admitting: *Deleted

## 2013-03-18 ENCOUNTER — Ambulatory Visit (HOSPITAL_COMMUNITY)
Admission: RE | Admit: 2013-03-18 | Discharge: 2013-03-18 | Disposition: A | Discharge status: Home / Self Care | Payer: Medicare Other | Source: Ambulatory Visit | Attending: Gastroenterology | Admitting: Gastroenterology

## 2013-03-18 ENCOUNTER — Encounter (HOSPITAL_COMMUNITY): Payer: Medicare Other | Admitting: *Deleted

## 2013-03-18 DIAGNOSIS — E119 Type 2 diabetes mellitus without complications: Secondary | ICD-10-CM | POA: Diagnosis not present

## 2013-03-18 DIAGNOSIS — G4733 Obstructive sleep apnea (adult) (pediatric): Secondary | ICD-10-CM | POA: Insufficient documentation

## 2013-03-18 DIAGNOSIS — K5730 Diverticulosis of large intestine without perforation or abscess without bleeding: Secondary | ICD-10-CM | POA: Diagnosis not present

## 2013-03-18 DIAGNOSIS — I10 Essential (primary) hypertension: Secondary | ICD-10-CM | POA: Diagnosis not present

## 2013-03-18 DIAGNOSIS — K5100 Ulcerative (chronic) pancolitis without complications: Secondary | ICD-10-CM | POA: Insufficient documentation

## 2013-03-18 DIAGNOSIS — K5190 Ulcerative colitis, unspecified, without complications: Secondary | ICD-10-CM | POA: Diagnosis not present

## 2013-03-18 HISTORY — PX: COLONOSCOPY WITH PROPOFOL: SHX5780

## 2013-03-18 LAB — GLUCOSE, CAPILLARY: Glucose-Capillary: 128 mg/dL — ABNORMAL HIGH (ref 70–99)

## 2013-03-18 SURGERY — COLONOSCOPY WITH PROPOFOL
Anesthesia: Monitor Anesthesia Care

## 2013-03-18 MED ORDER — PROPOFOL 10 MG/ML IV BOLUS
INTRAVENOUS | Status: DC | PRN
  Administered 2013-03-18: 50 mg via INTRAVENOUS

## 2013-03-18 MED ORDER — SODIUM CHLORIDE 0.9 % IV SOLN
INTRAVENOUS | Status: DC
  Administered 2013-03-18: 1000 mL via INTRAVENOUS
  Administered 2013-03-18: 12:00:00 via INTRAVENOUS

## 2013-03-18 MED ORDER — KETAMINE HCL 10 MG/ML IJ SOLN
INTRAMUSCULAR | Status: DC | PRN
  Administered 2013-03-18: 25 mg via INTRAVENOUS

## 2013-03-18 MED ORDER — MIDAZOLAM HCL 5 MG/5ML IJ SOLN
INTRAMUSCULAR | Status: DC | PRN
  Administered 2013-03-18 (×2): 1 mg via INTRAVENOUS

## 2013-03-18 MED ORDER — SPOT INK MARKER SYRINGE KIT
PACK | SUBMUCOSAL | Status: AC
  Filled 2013-03-18: qty 5

## 2013-03-18 MED ORDER — PROPOFOL INFUSION 10 MG/ML OPTIME
INTRAVENOUS | Status: DC | PRN
  Administered 2013-03-18: 100 ug/kg/min via INTRAVENOUS

## 2013-03-18 MED ORDER — LACTATED RINGERS IV SOLN: INTRAVENOUS | Status: DC

## 2013-03-18 MED ORDER — SPOT INK MARKER SYRINGE KIT
PACK | SUBMUCOSAL | Status: DC | PRN
  Administered 2013-03-18: 3 mL via SUBMUCOSAL

## 2013-03-18 MED ORDER — ONDANSETRON HCL 4 MG/2ML IJ SOLN
INTRAMUSCULAR | Status: DC | PRN
  Administered 2013-03-18: 4 mg via INTRAVENOUS

## 2013-03-18 SURGICAL SUPPLY — 22 items

## 2013-03-18 NOTE — Transfer of Care (Signed)
Immediate Anesthesia Transfer of Care Note  Patient: Paul Daniels  Procedure(s) Performed: Procedure(s): COLONOSCOPY WITH PROPOFOL (N/A)  Patient Location: PACU  Anesthesia Type:MAC  Level of Consciousness: sedated  Airway & Oxygen Therapy: Patient Spontanous Breathing and Patient connected to nasal cannula oxygen  Post-op Assessment: Report given to PACU RN and Post -op Vital signs reviewed and stable  Post vital signs: Reviewed and stable  Complications: No apparent anesthesia complications

## 2013-03-18 NOTE — Op Note (Signed)
Procedure: Surveillance colonoscopy. Universal ulcerative proctocolitis.  Endoscopist: Danise Edge  Premedication: Propofol administered by anesthesia  Procedure: The patient was placed in the left lateral decubitus position. Anal inspection and digital rectal exam were normal. The Pentax pediatric colonoscope was introduced into the rectum and advanced to the cecum. A normal-appearing ileocecal valve and appendiceal orifice were identified. Colonic preparation for the exam today was good.  Rectum . Normal. Retroflex view of the distal rectum normal.  Sigmoid colon and descending colon. Left colonic diverticulosis  Splenic flexure. Normal.  Transverse colon. Normal.  Hepatic flexure. An  inflammed appearing fold with punctate ulceration was biopsied to rule out neoplasm.  Ascending colon. Normal.  Cecum and ileocecal valve. Normal.  Assessment  #1. In active universal ulcerative proctocolitis  #2. Left colonic diverticulosis  #3. Inflamed-appearing fold in the hepatic flexure biopsied. The adjacent wall was tattooed with spot for future identification  #4. A total of 32 surveillance colon biopsies were performed. 8 biopsies were performed from the right colon, a biopsies were performed from the transverse colon, a biopsies were performed from the descending colon, a biopsies were performed from the rectosigmoid colon.

## 2013-03-18 NOTE — Preoperative (Signed)
Beta Blockers   Reason not to administer Beta Blockers:Not Applicable, not on home BB 

## 2013-03-18 NOTE — H&P (Signed)
  Problem: Universal ulcerative proctocolitis  History: The patient is a 67 year old male born January 28, 1946. 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 He is scheduled to undergo a surveillance colonoscopy.  Past medical history: Type 2 diabetes mellitus. Hypertension. Anxiety with depression. Hypercholesterolemia. Benign prostatic hypertrophy. Obstructive sleep apnea syndrome. Degenerative joint disease. Universal ulcerative proctocolitis. Cholecystectomy. Inguinal hernia repair.  Allergies: Beta blockers cause wheezing.  Exam: The patient is alert and lying comfortably on the endoscopy stretcher. Abdomen is soft and nontender to palpation. Lungs are clear to auscultation. Cardiac exam reveals a regular rhythm.  Plan: Proceed with surveillance colonoscopy.

## 2013-03-18 NOTE — Anesthesia Preprocedure Evaluation (Signed)
Anesthesia Evaluation  Patient identified by MRN, date of birth, ID band Patient awake    Reviewed: Allergy & Precautions, H&P , NPO status , Patient's Chart, lab work & pertinent test results, reviewed documented beta blocker date and time   Airway Mallampati: II TM Distance: >3 FB Neck ROM: Full    Dental  (+) Dental Advisory Given   Pulmonary sleep apnea ,  Sleep study, CPAP recommended, non-compliant w/ CPAP breath sounds clear to auscultation        Cardiovascular hypertension, Pt. on medications Rhythm:Regular Rate:Normal  Denies cardiac symptoms   Neuro/Psych negative neurological ROS  negative psych ROS   GI/Hepatic negative GI ROS, Neg liver ROS,   Endo/Other    Renal/GU negative Renal ROS   Urinary retention/BPH    Musculoskeletal negative musculoskeletal ROS (+)   Abdominal   Peds negative pediatric ROS (+)  Hematology negative hematology ROS (+)   Anesthesia Other Findings Lost a filling last night, upper left  Reproductive/Obstetrics negative OB ROS                           Anesthesia Physical  Anesthesia Plan  ASA: II  Anesthesia Plan: MAC   Post-op Pain Management:    Induction:   Airway Management Planned:   Additional Equipment:   Intra-op Plan:   Post-operative Plan:   Informed Consent: I have reviewed the patients History and Physical, chart, labs and discussed the procedure including the risks, benefits and alternatives for the proposed anesthesia with the patient or authorized representative who has indicated his/her understanding and acceptance.   Dental advisory given  Plan Discussed with: CRNA and Surgeon  Anesthesia Plan Comments:         Anesthesia Quick Evaluation

## 2013-03-18 NOTE — Anesthesia Postprocedure Evaluation (Signed)
  Anesthesia Post-op Note  Patient: Paul Daniels  Procedure(s) Performed: Procedure(s) (LRB): COLONOSCOPY WITH PROPOFOL (N/A)  Patient Location: PACU  Anesthesia Type: MAC  Level of Consciousness: awake and alert   Airway and Oxygen Therapy: Patient Spontanous Breathing  Post-op Pain: mild  Post-op Assessment: Post-op Vital signs reviewed, Patient's Cardiovascular Status Stable, Respiratory Function Stable, Patent Airway and No signs of Nausea or vomiting  Last Vitals:  Filed Vitals:   03/18/13 1200  BP: 112/74  Pulse: 76  Temp: 36.5 C  Resp: 16    Post-op Vital Signs: stable   Complications: No apparent anesthesia complications

## 2013-03-19 ENCOUNTER — Encounter (HOSPITAL_COMMUNITY): Payer: Self-pay | Admitting: Gastroenterology

## 2013-03-19 DIAGNOSIS — N401 Enlarged prostate with lower urinary tract symptoms: Secondary | ICD-10-CM | POA: Diagnosis not present

## 2013-03-20 DIAGNOSIS — Z23 Encounter for immunization: Secondary | ICD-10-CM | POA: Diagnosis not present

## 2013-03-31 DIAGNOSIS — F4323 Adjustment disorder with mixed anxiety and depressed mood: Secondary | ICD-10-CM | POA: Diagnosis not present

## 2013-04-09 DIAGNOSIS — F4323 Adjustment disorder with mixed anxiety and depressed mood: Secondary | ICD-10-CM | POA: Diagnosis not present

## 2013-04-15 DIAGNOSIS — D3701 Neoplasm of uncertain behavior of lip: Secondary | ICD-10-CM | POA: Diagnosis not present

## 2013-04-16 DIAGNOSIS — J069 Acute upper respiratory infection, unspecified: Secondary | ICD-10-CM | POA: Diagnosis not present

## 2013-04-17 DIAGNOSIS — N401 Enlarged prostate with lower urinary tract symptoms: Secondary | ICD-10-CM | POA: Diagnosis not present

## 2013-04-17 DIAGNOSIS — R339 Retention of urine, unspecified: Secondary | ICD-10-CM | POA: Diagnosis not present

## 2013-04-21 DIAGNOSIS — K1370 Unspecified lesions of oral mucosa: Secondary | ICD-10-CM | POA: Diagnosis not present

## 2013-04-29 DIAGNOSIS — F4323 Adjustment disorder with mixed anxiety and depressed mood: Secondary | ICD-10-CM | POA: Diagnosis not present

## 2013-05-09 DIAGNOSIS — G500 Trigeminal neuralgia: Secondary | ICD-10-CM | POA: Diagnosis not present

## 2013-07-08 DIAGNOSIS — E119 Type 2 diabetes mellitus without complications: Secondary | ICD-10-CM | POA: Diagnosis not present

## 2013-07-08 DIAGNOSIS — L820 Inflamed seborrheic keratosis: Secondary | ICD-10-CM | POA: Diagnosis not present

## 2013-07-08 DIAGNOSIS — Z1331 Encounter for screening for depression: Secondary | ICD-10-CM | POA: Diagnosis not present

## 2013-07-08 DIAGNOSIS — Z23 Encounter for immunization: Secondary | ICD-10-CM | POA: Diagnosis not present

## 2013-07-08 DIAGNOSIS — N400 Enlarged prostate without lower urinary tract symptoms: Secondary | ICD-10-CM | POA: Diagnosis not present

## 2013-07-08 DIAGNOSIS — M719 Bursopathy, unspecified: Secondary | ICD-10-CM | POA: Diagnosis not present

## 2013-07-08 DIAGNOSIS — Z0000 Encounter for general adult medical examination without abnormal findings: Secondary | ICD-10-CM | POA: Diagnosis not present

## 2013-07-08 DIAGNOSIS — R7989 Other specified abnormal findings of blood chemistry: Secondary | ICD-10-CM | POA: Diagnosis not present

## 2013-07-08 DIAGNOSIS — E559 Vitamin D deficiency, unspecified: Secondary | ICD-10-CM | POA: Diagnosis not present

## 2013-07-08 DIAGNOSIS — E782 Mixed hyperlipidemia: Secondary | ICD-10-CM | POA: Diagnosis not present

## 2013-07-08 DIAGNOSIS — I10 Essential (primary) hypertension: Secondary | ICD-10-CM | POA: Diagnosis not present

## 2013-07-08 DIAGNOSIS — M67919 Unspecified disorder of synovium and tendon, unspecified shoulder: Secondary | ICD-10-CM | POA: Diagnosis not present

## 2013-07-14 DIAGNOSIS — G479 Sleep disorder, unspecified: Secondary | ICD-10-CM | POA: Diagnosis not present

## 2013-08-25 DIAGNOSIS — G4700 Insomnia, unspecified: Secondary | ICD-10-CM | POA: Diagnosis not present

## 2013-10-06 DIAGNOSIS — G479 Sleep disorder, unspecified: Secondary | ICD-10-CM | POA: Diagnosis not present

## 2013-10-31 DIAGNOSIS — E119 Type 2 diabetes mellitus without complications: Secondary | ICD-10-CM | POA: Diagnosis not present

## 2013-10-31 DIAGNOSIS — J45909 Unspecified asthma, uncomplicated: Secondary | ICD-10-CM | POA: Diagnosis not present

## 2013-11-26 DIAGNOSIS — L570 Actinic keratosis: Secondary | ICD-10-CM | POA: Diagnosis not present

## 2013-11-26 DIAGNOSIS — B351 Tinea unguium: Secondary | ICD-10-CM | POA: Diagnosis not present

## 2013-11-26 DIAGNOSIS — B353 Tinea pedis: Secondary | ICD-10-CM | POA: Diagnosis not present

## 2013-12-16 DIAGNOSIS — G4700 Insomnia, unspecified: Secondary | ICD-10-CM | POA: Diagnosis not present

## 2014-01-05 DIAGNOSIS — F4323 Adjustment disorder with mixed anxiety and depressed mood: Secondary | ICD-10-CM | POA: Diagnosis not present

## 2014-01-21 DIAGNOSIS — H579 Unspecified disorder of eye and adnexa: Secondary | ICD-10-CM | POA: Diagnosis not present

## 2014-01-21 DIAGNOSIS — G4700 Insomnia, unspecified: Secondary | ICD-10-CM | POA: Diagnosis not present

## 2014-02-09 ENCOUNTER — Telehealth: Payer: Self-pay | Admitting: Pulmonary Disease

## 2014-02-09 NOTE — Telephone Encounter (Signed)
Pt is aware that [facility] had a cancellation on his schedule tomorrow for 2pm; pt to arrive at 1:45pm. Nothing more needed at this time.

## 2014-02-09 NOTE — Telephone Encounter (Signed)
Pt states he is aware of "new consult" time needed due to being over 3 years since seeing [place] last. Pt states he went to his PCP about 3 weeks ago and was given Pulmicort inhaler sample to use. Was told by PCP-N. Gates to contact [facility] for OV is no improvement. Pt is wheezing, SOB at times, coughing as well. He states these get worse at night for after eating a meal.   Pt is due to fly out of country for 10 days to [place] on 02-26-2014 and would like to be seen prior to this to make sure its safe to go out of the country. [place] you do not have any openings until 03-03-14 for consult;please advise as patient was seen last for pulmonary and sleep. Thanks

## 2014-02-09 NOTE — Telephone Encounter (Signed)
I am leaving to go OOT this Saturday, and won't be back until 9/29.  Either I have something this week that you can fit into a consult slot, or he may have to see someone else.  Let him know just bad timing for me to be able to work him in.

## 2014-02-10 ENCOUNTER — Ambulatory Visit (INDEPENDENT_AMBULATORY_CARE_PROVIDER_SITE_OTHER): Payer: Medicare Other | Admitting: Pulmonary Disease

## 2014-02-10 ENCOUNTER — Encounter: Payer: Self-pay | Admitting: Pulmonary Disease

## 2014-02-10 ENCOUNTER — Telehealth: Payer: Self-pay | Admitting: Pulmonary Disease

## 2014-02-10 VITALS — BP 120/78 | HR 83 | Temp 98.2°F | Ht 69.0 in | Wt 201.4 lb

## 2014-02-10 DIAGNOSIS — R062 Wheezing: Secondary | ICD-10-CM

## 2014-02-10 DIAGNOSIS — R059 Cough, unspecified: Secondary | ICD-10-CM

## 2014-02-10 DIAGNOSIS — R05 Cough: Secondary | ICD-10-CM | POA: Diagnosis not present

## 2014-02-10 DIAGNOSIS — R053 Chronic cough: Secondary | ICD-10-CM

## 2014-02-10 MED ORDER — AMOXICILLIN-POT CLAVULANATE 875-125 MG PO TABS: 1.0000 | ORAL_TABLET | Freq: Two times a day (BID) | ORAL | Status: DC

## 2014-02-10 MED ORDER — OMEPRAZOLE 40 MG PO CPDR: DELAYED_RELEASE_CAPSULE | ORAL | Status: DC

## 2014-02-10 NOTE — Telephone Encounter (Signed)
lmtcb x1 Pt needed to go down for CXR after OV.

## 2014-02-10 NOTE — Telephone Encounter (Signed)
lmomtcb x1 for pt 

## 2014-02-10 NOTE — Progress Notes (Signed)
   Subjective:    Patient ID: Paul Daniels, male    DOB: Nov 27, 1945, 68 y.o.   MRN: [id number]  HPI The patient is a 68 year old male who I've been asked to see for persistent cough and wheezing. I last saw him in 2000 or 11 with chronic cough, where he had normal spirometry and felt to have more of an upper airway source. He did not respond to cough suppression and a trial of proton pump inhibitors for possible LPR, but did have mild pansinusitis on sinus CT despite not having a lot of symptoms. He was treated with a course of antibiotics and had significant improvement. The patient tells me that his cough resolved, and was not an issue until approximately 3 months ago. He then began to notice increasing audible wheezing, which was worse at night after dinner and especially when lying down. He would cough up small quantities of nonpurulent mucus, his wheezing with then go away, and we have been start back once he accumulated a certain amount of mucus. He is denying any sinus symptoms or postnasal drip, and thinks that his cough is coming from his chest. He has not had any reflux symptoms, and again did not respond in the past to a trial of proton pump inhibitors. He is also had some worsening dyspnea on exertion. He has been tried on Pulmicort that he only took once a day, and has seen no change in his breathing.   Review of Systems  Constitutional: Negative for fever and unexpected weight change.  HENT: Positive for congestion. Negative for dental problem, ear pain, nosebleeds, postnasal drip, rhinorrhea, sinus pressure, sneezing, sore throat and trouble swallowing.   Eyes: Negative for redness and itching.  Respiratory: Positive for cough, shortness of breath and wheezing. Negative for chest tightness.   Cardiovascular: Negative for palpitations and leg swelling.  Gastrointestinal: Negative for nausea and vomiting.  Genitourinary: Negative for dysuria.  Musculoskeletal: Negative for joint swelling.   Skin: Negative for rash.  Neurological: Negative for headaches.  Hematological: Does not bruise/bleed easily.  Psychiatric/Behavioral: Negative for dysphoric mood. The patient is not nervous/anxious.        Objective:   Physical Exam Constitutional:  Well developed, no acute distress  HENT:  Nares patent without discharge, mild erythema  Oropharynx without exudate, palate and uvula are normal, +secretions pooling in back of throat.  Eyes:  Perrla, eomi, no scleral icterus  Neck:  No JVD, no TMG  Cardiovascular:  Normal rate, regular rhythm, no rubs or gallops.  No murmurs        Intact distal pulses  Pulmonary :  Normal breath sounds, no stridor or respiratory distress   No rales, rhonchi, or wheezing  Abdominal:  Soft, nondistended, bowel sounds present.  No tenderness noted.   Musculoskeletal:  No lower extremity edema noted.  Lymph Nodes:  No cervical lymphadenopathy noted  Skin:  No cyanosis noted  Neurologic:  Alert, appropriate, moves all 4 extremities without obvious deficit.         Assessment & Plan:

## 2014-02-10 NOTE — Assessment & Plan Note (Signed)
Patient has a chronic cough of 3 months duration that again sounds more upper airway in origin.  The last time he had this issue a CT of his sinuses showed mild pansinusitis, and his cough resolved with a course of antibiotics. His history today clearly sounds more upper airway, and his spirometry is normal. However, this does not exclude cough variant asthma. He also is having increased cough while eating and also lying down at night. This is very suspicious for postnasal drip and possible laryngopharyngeal reflux. At this point, I would like to treat him with a course of antibiotics for possible sinusitis, and also for postnasal drip and reflux. If he does not see significant improvement, I would consider a methacholine challenge test to rule out cough variant asthma.

## 2014-02-10 NOTE — Telephone Encounter (Signed)
Patient is wanting to know why he needs a chest xray.  He states he was the understanding that [facility] did not want him to go for xray.

## 2014-02-10 NOTE — Patient Instructions (Signed)
Take dymista 2 sprays each nostril each am for next 2 weeks. Take omeprazole 40mg  for possible acid reflux each night about 22min BEFORE dinnner on an empty stomach for next 2 weeks.  augmentin 875mg  one in am and pm on full stomach for one week.  Take with a large glass of water.  Please call me in 2 weeks with how things are going

## 2014-02-11 NOTE — Telephone Encounter (Signed)
Per Mindy, JC already spoke with the pt and nothing further needed Will close encounter

## 2014-02-18 ENCOUNTER — Telehealth: Payer: Self-pay | Admitting: Pulmonary Disease

## 2014-02-18 NOTE — Telephone Encounter (Signed)
Per 02/10/14 OV; Patient Instructions      Take dymista 2 sprays each nostril each am for next 2 weeks. Take omeprazole 40mg  for possible acid reflux each night about 35min BEFORE dinnner on an empty stomach for next 2 weeks.   augmentin 875mg  one in am and pm on full stomach for one week.  Take with a large glass of water.   Please call me in 2 weeks with how things are going   Called spoke with pt. He reports he is feeling some better. The problem he is having is when he is lying down he hears rattling in his chest, coughing, feels like upper chest congested. Pt has 1 day left of the ABX left. Pt reports when he is sitting he does not have this problem or any during the day. He is still using the dymista and taking the omeprazole. He does not feel like he is having any PND or reflux. I offered to send message to [facility] but he prefers to wait until [facility] comes back on Tuesday since he knows his "case". Please advise thanks

## 2014-02-24 NOTE — Telephone Encounter (Signed)
At this point, we have treated him aggressively for upper airway causes of cough.  If he is still having issues, would like to do methacholine challenge testing to evaluate him for asthma .  This involves breathing a medication that can stimulate asthma, and then breathing into the spirometer.  Very safe.  Let me know if he is ok with this and i can order.

## 2014-02-25 NOTE — Telephone Encounter (Signed)
Called spoke with pt. He reports he has much improved but when he is lying down he still wheezes. He wants to hold for the methacholine challenge for now. Nothing further needed

## 2014-03-23 ENCOUNTER — Telehealth: Payer: Self-pay | Admitting: Pulmonary Disease

## 2014-03-23 DIAGNOSIS — E119 Type 2 diabetes mellitus without complications: Secondary | ICD-10-CM | POA: Diagnosis not present

## 2014-03-23 DIAGNOSIS — H2513 Age-related nuclear cataract, bilateral: Secondary | ICD-10-CM | POA: Diagnosis not present

## 2014-03-23 NOTE — Telephone Encounter (Signed)
Called spoke with pt. He is willing to have the methacholine test done. He wants [place] to order and schedule this. Please advise [place] thanks

## 2014-03-23 NOTE — Telephone Encounter (Signed)
lmtcb X1 

## 2014-03-23 NOTE — Telephone Encounter (Signed)
Let pt know that wheezing that worsens at night upon lying down can be due to postnasal drip, reflux disease, heart disease, and finally asthma.  There is something called cough variant asthma where people cough but do not have asthma attacks.  It sometimes gives you normal breathing studies, but can be diagnosed by PROVOKING AN ATTACK with a medication called methacholine in order to make an accurate diagnosis.

## 2014-03-23 NOTE — Telephone Encounter (Signed)
Pt returned call.  Holly D Pryor ° °

## 2014-03-23 NOTE — Telephone Encounter (Signed)
Spoke with pt, he states he's been sleeping in a recliner for 2 months now since laying flat down causes him to cough, wheeze.  States his cough is prod with clear thin mucus.  He says that [facility] has discussed doing a methacholine challenge as his next diagnostic step, but wants to know why that is recommended if [place] or his pcp does not believe he has asthma. States he is willing to do the test if needed but would like to know why this is recommended.  Pt doesn't know if this can be discussed over the phone or if he needs an appt for this.  Dr Gwenette Greet please advise.  Thank you.

## 2014-03-24 ENCOUNTER — Other Ambulatory Visit: Payer: Self-pay | Admitting: Pulmonary Disease

## 2014-03-24 DIAGNOSIS — R053 Chronic cough: Secondary | ICD-10-CM

## 2014-03-24 DIAGNOSIS — R05 Cough: Secondary | ICD-10-CM

## 2014-03-24 NOTE — Telephone Encounter (Signed)
I had already ordered this earlier today, but can't find under referrals.  Talk with POC and see if this is in "[place]" of [facility] somewhere??

## 2014-03-24 NOTE — Telephone Encounter (Signed)
I spoke with the pt and notified that we are are working on this  He verbalized understanding  Will forward to [facility] to check the status of this being scheduled

## 2014-03-24 NOTE — Telephone Encounter (Signed)
PT is calling back because no one has called him to sched the meth challenge.  Pt is upset because he hasn't been able to sleep for days.  Satira Anis

## 2014-03-24 NOTE — Telephone Encounter (Signed)
[place], can we go ahead and schedule the MCT?

## 2014-03-25 DIAGNOSIS — Z23 Encounter for immunization: Secondary | ICD-10-CM | POA: Diagnosis not present

## 2014-03-25 NOTE — Telephone Encounter (Signed)
Pt has been rescheduled for meth to 03/30/14@10 :30am and he is aware of this appt Joellen Jersey

## 2014-03-27 DIAGNOSIS — N401 Enlarged prostate with lower urinary tract symptoms: Secondary | ICD-10-CM | POA: Diagnosis not present

## 2014-03-27 DIAGNOSIS — R3916 Straining to void: Secondary | ICD-10-CM | POA: Diagnosis not present

## 2014-03-30 ENCOUNTER — Ambulatory Visit (HOSPITAL_COMMUNITY)
Admission: RE | Admit: 2014-03-30 | Discharge: 2014-03-30 | Disposition: A | Discharge status: Home / Self Care | Payer: Medicare Other | Source: Ambulatory Visit | Attending: Pulmonary Disease | Admitting: Pulmonary Disease

## 2014-03-30 DIAGNOSIS — R0602 Shortness of breath: Secondary | ICD-10-CM | POA: Diagnosis not present

## 2014-03-30 DIAGNOSIS — F17210 Nicotine dependence, cigarettes, uncomplicated: Secondary | ICD-10-CM | POA: Diagnosis not present

## 2014-03-30 DIAGNOSIS — R053 Chronic cough: Secondary | ICD-10-CM

## 2014-03-30 DIAGNOSIS — R05 Cough: Secondary | ICD-10-CM | POA: Diagnosis not present

## 2014-03-30 LAB — PULMONARY FUNCTION TEST
FEF 25-75 PRE: 2.06 L/s
FEF 25-75 Post: 2.29 L/sec
FEF2575-%Change-Post: 11 %
FEF2575-%PRED-PRE: 83 %
FEF2575-%Pred-Post: 93 %
FEV1-%CHANGE-POST: 2 %
FEV1-%Pred-Post: 90 %
FEV1-%Pred-Pre: 87 %
FEV1-Post: 2.89 L
FEV1-Pre: 2.81 L
FEV1FVC-%Change-Post: 2 %
FEV1FVC-%Pred-Pre: 100 %
FEV6-%CHANGE-POST: 0 %
FEV6-%PRED-POST: 92 %
FEV6-%Pred-Pre: 91 %
FEV6-Post: 3.76 L
FEV6-Pre: 3.74 L
FEV6FVC-%CHANGE-POST: 0 %
FEV6FVC-%Pred-Post: 105 %
FEV6FVC-%Pred-Pre: 105 %
FVC-%Change-Post: 0 %
FVC-%PRED-POST: 87 %
FVC-%PRED-PRE: 87 %
FVC-POST: 3.79 L
FVC-Pre: 3.77 L
POST FEV6/FVC RATIO: 99 %
Post FEV1/FVC ratio: 76 %
Pre FEV1/FVC ratio: 74 %
Pre FEV6/FVC Ratio: 99 %

## 2014-03-30 MED ORDER — METHACHOLINE 16 MG/ML NEB SOLN
2.0000 mL | Freq: Once | RESPIRATORY_TRACT | Status: AC
  Administered 2014-03-30: 32 mg via RESPIRATORY_TRACT

## 2014-03-30 MED ORDER — METHACHOLINE 0.25 MG/ML NEB SOLN
2.0000 mL | Freq: Once | RESPIRATORY_TRACT | Status: AC
  Administered 2014-03-30: 0.5 mg via RESPIRATORY_TRACT

## 2014-03-30 MED ORDER — METHACHOLINE 1 MG/ML NEB SOLN
2.0000 mL | Freq: Once | RESPIRATORY_TRACT | Status: AC
  Administered 2014-03-30: 2 mg via RESPIRATORY_TRACT

## 2014-03-30 MED ORDER — ALBUTEROL SULFATE (2.5 MG/3ML) 0.083% IN NEBU
2.5000 mg | INHALATION_SOLUTION | Freq: Once | RESPIRATORY_TRACT | Status: AC
  Administered 2014-03-30: 2.5 mg via RESPIRATORY_TRACT

## 2014-03-30 MED ORDER — METHACHOLINE 0.0625 MG/ML NEB SOLN
2.0000 mL | Freq: Once | RESPIRATORY_TRACT | Status: AC
  Administered 2014-03-30: 0.125 mg via RESPIRATORY_TRACT

## 2014-03-30 MED ORDER — SODIUM CHLORIDE 0.9 % IN NEBU
3.0000 mL | INHALATION_SOLUTION | Freq: Once | RESPIRATORY_TRACT | Status: AC
  Administered 2014-03-30: 3 mL via RESPIRATORY_TRACT

## 2014-03-30 MED ORDER — METHACHOLINE 4 MG/ML NEB SOLN
2.0000 mL | Freq: Once | RESPIRATORY_TRACT | Status: AC
  Administered 2014-03-30: 8 mg via RESPIRATORY_TRACT

## 2014-03-31 ENCOUNTER — Encounter (HOSPITAL_COMMUNITY): Payer: Medicare Other

## 2014-04-03 ENCOUNTER — Telehealth: Payer: Self-pay | Admitting: Pulmonary Disease

## 2014-04-03 NOTE — Telephone Encounter (Signed)
Spoke with Paul Daniels. Had Methacholine Challenge done and is wanting results and what is the next step. Paul Daniels will not be back in town until Tuesday, so please call then. Please advise thanks

## 2014-04-06 ENCOUNTER — Ambulatory Visit (INDEPENDENT_AMBULATORY_CARE_PROVIDER_SITE_OTHER): Payer: Medicare Other | Admitting: Adult Health

## 2014-04-06 ENCOUNTER — Other Ambulatory Visit: Payer: Self-pay | Admitting: Pulmonary Disease

## 2014-04-06 ENCOUNTER — Ambulatory Visit (INDEPENDENT_AMBULATORY_CARE_PROVIDER_SITE_OTHER)
Admission: RE | Admit: 2014-04-06 | Discharge: 2014-04-06 | Disposition: A | Discharge status: Home / Self Care | Payer: Medicare Other | Source: Ambulatory Visit | Attending: Adult Health | Admitting: Adult Health

## 2014-04-06 ENCOUNTER — Encounter: Payer: Self-pay | Admitting: Adult Health

## 2014-04-06 VITALS — BP 122/96 | HR 92 | Temp 97.8°F | Ht 69.0 in | Wt 206.8 lb

## 2014-04-06 DIAGNOSIS — R509 Fever, unspecified: Secondary | ICD-10-CM | POA: Diagnosis not present

## 2014-04-06 DIAGNOSIS — R053 Chronic cough: Secondary | ICD-10-CM

## 2014-04-06 DIAGNOSIS — R05 Cough: Secondary | ICD-10-CM

## 2014-04-06 DIAGNOSIS — J069 Acute upper respiratory infection, unspecified: Secondary | ICD-10-CM | POA: Diagnosis not present

## 2014-04-06 MED ORDER — HYDROCODONE-HOMATROPINE 5-1.5 MG/5ML PO SYRP: 5.0000 mL | ORAL_SOLUTION | Freq: Four times a day (QID) | ORAL | Status: DC | PRN

## 2014-04-06 NOTE — Assessment & Plan Note (Signed)
URI /Viral illness Neg Flu swab in office  cxr w/ no acute process    Plan  Fluids and rest  mucinex As needed   Advil As needed  For aches and fever.  Saline nasal rinses As needed    follow up Dr. Gwenette Greet in 6 weeks and As needed   Please contact office for sooner follow up if symptoms do not improve or worsen or seek emergency care

## 2014-04-06 NOTE — Progress Notes (Signed)
Ov reviewed, and agree with plan as outlined.  

## 2014-04-06 NOTE — Progress Notes (Signed)
   Subjective:    Patient ID: Paul Daniels, male    DOB: [birth year], 68 y.o.   MRN: [id number]  HPI 68 yo male minimal smoking exposure , followed in pulmonary clinic for chronic cough.  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  Complains of chills, prod cough with clear mucus, fever up to 102.6 at onset.  symptoms began 1 day ago.  Has body aches . Took advil and feels better today with no fever.  He had flu shot 1 week ago.  Flu swab in office was neg.  CXR today shows no acute process.   He was recently seen in office chronic cough flare , tx w/ augmentin and cough control regimen  Does not feel cough is getting any better.  He was sent for MCT which was neg on 11/2.  No fever, chest pain, orthopnea, n/v/d, edema , hemoptysis.  Has post nasal drip . Denies reflux.     Review of Systems Constitutional:   No  weight loss, night sweats,  Fevers, chills, fatigue, or  lassitude.  HEENT:   No headaches,  Difficulty swallowing,  Tooth/dental problems, or  Sore throat,                No sneezing, itching, ear ache, nasal congestion, + post nasal drip,   CV:  No chest pain,  Orthopnea, PND, swelling in lower extremities, anasarca, dizziness, palpitations, syncope.   GI  No heartburn, indigestion, abdominal pain, nausea, vomiting, diarrhea, change in bowel habits, loss of appetite, bloody stools.   Resp:  .  No chest wall deformity  Skin: no rash or lesions.  GU: no dysuria, change in color of urine, no urgency or frequency.  No flank pain, no hematuria   MS:  No joint pain or swelling.  No decreased range of motion.  No back pain.  Psych:  No change in mood or affect. No depression or anxiety.  No memory loss.          Objective:   Physical Exam GEN: A/Ox3; pleasant , NAD  HEENT:  [place]/AT,  EACs-clear, TMs-wnl, NOSE-clear, THROAT-clear, no lesions, no postnasal drip or exudate  noted.   NECK:  Supple w/ fair ROM; no JVD; normal carotid impulses w/o bruits; no thyromegaly or nodules palpated; no lymphadenopathy.  RESP  Clear  P & A; w/o, wheezes/ rales/ or rhonchi.no accessory muscle use, no dullness to percussion  CARD:  RRR, no m/r/g  , no peripheral edema, pulses intact, no cyanosis or clubbing.  GI:   Soft & nt; nml bowel sounds; no organomegaly or masses detected.  Musco: Warm bil, no deformities or joint swelling noted.   Neuro: alert, no focal deficits noted.    Skin: Warm, no lesions or rashes         Assessment & Plan:

## 2014-04-06 NOTE — Assessment & Plan Note (Signed)
Normal Spirometry  Neg MCT  CXR w/ no acute process.  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  follow up Dr. Gwenette Greet in 6 weeks and As needed   Please contact office for sooner follow up if symptoms do not improve or worsen or seek emergency care

## 2014-04-06 NOTE — Patient Instructions (Addendum)
Fluids and rest  Advil As needed  For aches and fever.  Saline nasal rinses As needed   Take dymista 2 sprays each nostril each am  Take omeprazole 40mg  for possible acid reflux each night about 73min BEFORE dinnner on an empty stomach  Delsym 2 tsp Twice daily  For cough  Sips of water and sugarless candy for cough and throat clearing As needed   Hydromet 1 tsp every 6hr As needed  Cough, may make you sleepy.  follow up Dr. Gwenette Greet in 6 weeks and As needed   Please contact office for sooner follow up if symptoms do not improve or worsen or seek emergency care

## 2014-04-07 ENCOUNTER — Telehealth: Payer: Self-pay | Admitting: Adult Health

## 2014-04-07 NOTE — Telephone Encounter (Signed)
ATC line busy x 3 wcb 

## 2014-04-08 NOTE — Telephone Encounter (Signed)
Spoke with patient-states he got his concerns addressed by another MD-"he had to cath himself". Pt understands that our office attempted to reach him 3 times yesterday to address his concerns and his line stayed busy. Nothing more needed at this time.

## 2014-04-10 ENCOUNTER — Encounter: Payer: Self-pay | Admitting: Pulmonary Disease

## 2014-04-10 LAB — POCT INFLUENZA A/B
Influenza A, POC: NEGATIVE
Influenza B, POC: NEGATIVE

## 2014-04-10 MED ORDER — AZELASTINE-FLUTICASONE 137-50 MCG/ACT NA SUSP: 2.0000 | Freq: Every day | NASAL | Status: DC

## 2014-04-10 NOTE — Addendum Note (Signed)
Addended by: Parke Poisson E on: 04/10/2014 03:11 PM   Modules accepted: Orders

## 2014-04-10 NOTE — Telephone Encounter (Signed)
Spoke with pt. Aware of recs. He voiced understanding and needed nothing further

## 2014-04-10 NOTE — Telephone Encounter (Signed)
Spoke with the pt  He c/o increased cough x 6 days- "I feel better, but the cough is horrible" Coughing up moderate amounts of "barely yellow" sputum  No fever or other symptoms  KC, please advise, thanks! Allergies  Allergen Reactions  . Acetaminophen     Alcohol consumer-so does not want to use  . Beta Adrenergic Blockers     REACTION: wheezing - Patient states this happened years ago and does not remember wheezing.  . Terazosin Hcl     REACTION: nasal congestion

## 2014-04-10 NOTE — Telephone Encounter (Signed)
Since his issue is all upper airway and not lower airway, the steroid shot will not do anything for his lungs.  His cough is not bronchial, but laryngeal.  It may help inflammation in his throat or it may not.  I do not see any contraindication to having the injection.

## 2014-04-10 NOTE — Addendum Note (Signed)
Addended by: Parke Poisson E on: 04/10/2014 03:04 PM   Modules accepted: Orders

## 2014-04-28 ENCOUNTER — Ambulatory Visit (INDEPENDENT_AMBULATORY_CARE_PROVIDER_SITE_OTHER): Payer: Medicare Other | Admitting: Pulmonary Disease

## 2014-04-28 ENCOUNTER — Encounter: Payer: Self-pay | Admitting: Pulmonary Disease

## 2014-04-28 VITALS — BP 132/80 | HR 67 | Temp 97.0°F | Ht 69.0 in | Wt 207.4 lb

## 2014-04-28 DIAGNOSIS — R05 Cough: Secondary | ICD-10-CM

## 2014-04-28 DIAGNOSIS — R053 Chronic cough: Secondary | ICD-10-CM

## 2014-04-28 MED ORDER — GABAPENTIN 300 MG PO CAPS: ORAL_CAPSULE | ORAL | Status: DC

## 2014-04-28 NOTE — Patient Instructions (Signed)
Will start on gabapentin 300mg  one am and pm Will refer to ENT for upper airway exam Continue behavioral therapies If we continue to struggle with your upper airway issues, will refer to voice disorder center at [facility].

## 2014-04-28 NOTE — Assessment & Plan Note (Signed)
The patient continues to have an ongoing chronic cough that is upper airway in origin. He has had a recent acute exacerbation after an upper respiratory viral illness. There is no evidence for pulmonary disease at this time, with a clear exam, clear chest x-ray, and a negative methacholine challenge for hyperreactive airways. 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 I have offered to send him to the voice disorders Center at [facility], versus having an otolaryngology evaluation here and trying gabapentin for a neurogenic cough. He may also need a more formal GI evaluation if nothing else is found. At this point, the patient would like to stay local, and we'll therefore refer him to the [facility] ENT for an upper airway exam. Will also start him on gabapentin at a mild to moderate dose. Finally, I have stressed to him the importance of the behavioral therapies, including using hard candy to avoid throat clearing, voice rest.

## 2014-04-28 NOTE — Progress Notes (Signed)
   Subjective:    Patient ID: Paul Daniels, male    DOB: [birth year], 68 y.o.   MRN: [id number]  HPI The patient comes in today for an acute sick visit. 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 He has had an extensive pulmonary workup, including a chest x-ray, pulmonary function studies, and ultimately a normal methacholine challenge test. He has been treated for acid reflux and postnasal drip, and have urged him to follow the usual behavioral therapies for cyclical coughing. Despite this, he has continued to have an upper airway cough, that recently was exacerbated by a viral illness. He is denying any significant reflux or postnasal drip, but states that his cough immediately worsens on lying down. He is also clearing his throat continuously during our visit.   Review of Systems  Constitutional: Negative for fever and unexpected weight change.  HENT: Negative for congestion, dental problem, ear pain, nosebleeds, postnasal drip, rhinorrhea, sinus pressure, sneezing, sore throat and trouble swallowing.   Eyes: Negative for redness and itching.  Respiratory: Positive for cough and wheezing. Negative for chest tightness and shortness of breath.   Cardiovascular: Negative for palpitations and leg swelling.  Gastrointestinal: Negative for nausea and vomiting.  Genitourinary: Negative for dysuria.  Musculoskeletal: Negative for joint swelling.  Skin: Negative for rash.  Neurological: Negative for headaches.  Hematological: Does not bruise/bleed easily.  Psychiatric/Behavioral: Negative for dysphoric mood. The patient is not nervous/anxious.        Objective:   Physical Exam Overweight male in no acute distress Nose without purulence or discharge noted Neck without lymphadenopathy or thyromegaly Chest totally clear to auscultation Exam with regular rate and rhythm Lower extremities without edema, no cyanosis Alert  and oriented, moves all 4 extremities.       Assessment & Plan:

## 2014-05-06 ENCOUNTER — Encounter: Payer: Self-pay | Admitting: Pulmonary Disease

## 2014-05-06 DIAGNOSIS — R05 Cough: Secondary | ICD-10-CM | POA: Diagnosis not present

## 2014-05-06 DIAGNOSIS — X32XXXD Exposure to sunlight, subsequent encounter: Secondary | ICD-10-CM | POA: Diagnosis not present

## 2014-05-06 DIAGNOSIS — J387 Other diseases of larynx: Secondary | ICD-10-CM | POA: Diagnosis not present

## 2014-05-06 DIAGNOSIS — J329 Chronic sinusitis, unspecified: Secondary | ICD-10-CM | POA: Diagnosis not present

## 2014-05-06 DIAGNOSIS — L570 Actinic keratosis: Secondary | ICD-10-CM | POA: Diagnosis not present

## 2014-05-06 DIAGNOSIS — L820 Inflamed seborrheic keratosis: Secondary | ICD-10-CM | POA: Diagnosis not present

## 2014-05-06 DIAGNOSIS — D225 Melanocytic nevi of trunk: Secondary | ICD-10-CM | POA: Diagnosis not present

## 2014-05-07 NOTE — Telephone Encounter (Signed)
If ENT did not see anything in his throat to cause cough, he is to stay on the medication.  Give me some feedback in 2-3 weeks regarding if the med is helping.

## 2014-05-15 ENCOUNTER — Encounter: Payer: Self-pay | Admitting: Pulmonary Disease

## 2014-05-19 ENCOUNTER — Ambulatory Visit: Payer: Medicare Other | Admitting: Pulmonary Disease

## 2014-05-19 ENCOUNTER — Encounter: Payer: Self-pay | Admitting: Pulmonary Disease

## 2014-05-19 NOTE — Telephone Encounter (Signed)
As we discussed, if his upper airway exam is negative, would send to voice disorder center at [facility] for evaluation.  There is nothing else for me to do.  If he is agreeable, please send the order under ENT and put the above under comment section. Thanks.

## 2014-05-19 NOTE — Telephone Encounter (Signed)
It is very sensitive for airway disease.  If you have it, it picks it up.  It is not specific.  Meaning if you have an ABNORMAL study it is not always related to asthma.

## 2014-05-29 ENCOUNTER — Encounter: Payer: Self-pay | Admitting: Pulmonary Disease

## 2014-06-01 ENCOUNTER — Telehealth: Payer: Self-pay | Admitting: *Deleted

## 2014-06-01 NOTE — Telephone Encounter (Signed)
Per 2 patient emails sent in on 05/29/14:  1) Dr Gwenette Greet, I realize you couldn't field all patient out of office inquires! However, I do feel a 5 minute call would be indicated in this case. I realize I am a Medicare patient which puts even additional pressure on your time. If possible, I would be more than happy to schedule a private consult!  2) I have never received a response re the specificity of the MCT.  Based on my ENT's findings (or lack thereof) along with a second course of empirical Tx for sinusitis including 2 courses of different antibiotics as well as being on omeprazole for nearly 3 months for reflux, along with the fact that I have never experienced any of the usual clinical signs of a sinus infection, I am beginning to doubt that sinusitis is the the underlining cause of my ongoing ( >6 months) problem.  Also, your office's suggestion to be happy to make another referral to the voice and swallowing clinic makes no sense to me or any other medical professional I know in that I've been seen by an ENT and have no problem with my voice or in swallowing.   Dr. Gwenette Greet, I truly am not trying to be a difficult patient and I am sure you are a very qualified physician. I guess other than my ongoing problem, it is the frustration I am experiencing of not having my questions answered specifically. Force to cont in 2nd msg ------  Please advise [place] thanks

## 2014-06-01 NOTE — Telephone Encounter (Signed)
Make sure he know that I DID answer his question.  See message 12/22. I would be happy to discuss all of this with him, but will need OV to do this. Would prefer at the very end of the day in a 430 slot.

## 2014-06-02 DIAGNOSIS — R05 Cough: Secondary | ICD-10-CM | POA: Diagnosis not present

## 2014-06-02 DIAGNOSIS — R490 Dysphonia: Secondary | ICD-10-CM | POA: Diagnosis not present

## 2014-06-02 NOTE — Telephone Encounter (Signed)
Called pt and appt scheduled for Friday at 4:30.

## 2014-06-05 ENCOUNTER — Encounter: Payer: Self-pay | Admitting: Pulmonary Disease

## 2014-06-05 ENCOUNTER — Ambulatory Visit (INDEPENDENT_AMBULATORY_CARE_PROVIDER_SITE_OTHER): Payer: Medicare Other | Admitting: Pulmonary Disease

## 2014-06-05 VITALS — BP 140/88 | HR 85 | Temp 97.6°F | Ht 69.0 in | Wt 205.0 lb

## 2014-06-05 DIAGNOSIS — R05 Cough: Secondary | ICD-10-CM

## 2014-06-05 DIAGNOSIS — R053 Chronic cough: Secondary | ICD-10-CM

## 2014-06-05 NOTE — Progress Notes (Signed)
   Subjective:    Patient ID: Paul Daniels, male    DOB: 1945/10/31, 69 y.o.   MRN: [id number]  HPI The patient comes in today for follow-up of his chronic cough. He has had a fairly thorough pulmonary workup, with no lower airway etiology found. At the last visit, he was started on gabapentin for a possible neurogenic cough, but has not seen improvement. He was recently seen by otolaryngology where they did see mucopurulent secretions in his sinuses and draining down into his larynx. They also saw erythema and edema that was suggestive of laryngopharyngeal reflux. The patient continues to have cough despite treatment with antibiotics and acid suppression, and has a follow-up CT scan and evaluation with otolaryngology in the next week or so.   Review of Systems  Constitutional: Negative for fever and unexpected weight change.  HENT: Positive for congestion and postnasal drip. Negative for dental problem, ear pain, nosebleeds, rhinorrhea, sinus pressure, sneezing, sore throat and trouble swallowing.   Eyes: Negative for redness and itching.  Respiratory: Positive for cough and wheezing. Negative for chest tightness and shortness of breath.   Cardiovascular: Negative for palpitations and leg swelling.  Gastrointestinal: Negative for nausea and vomiting.  Genitourinary: Negative for dysuria.  Musculoskeletal: Negative for joint swelling.  Skin: Negative for rash.  Neurological: Negative for headaches.  Hematological: Does not bruise/bleed easily.  Psychiatric/Behavioral: Negative for dysphoric mood. The patient is not nervous/anxious.        Objective:   Physical Exam Overweight male in no acute distress Nose without purulence or discharge noted Lower extremities without edema, no cyanosis Alert and oriented, moves all 4 extremities.       Assessment & Plan:

## 2014-06-05 NOTE — Patient Instructions (Signed)
Continue with recommendations per ENT, and let's see what the upcoming imaging studies and exam show. Can consider an allergy evaluation if everything unremarkable, and would also consider going to voice disorder center.

## 2014-06-05 NOTE — Assessment & Plan Note (Addendum)
The patient has continued to have a chronic cough that is clearly upper airway in origin. He has a negative methacholine challenge study, and really has not seen any benefit from treatment for postnasal drip and reflux disease. We have also tried gabapentin for a neurogenic cough, with no significant improvement. He has seen otolaryngology recently, and was found to have some mucopurulence in his sinuses on endoscopic exam that was draining down into the pyriform sinus, as well as moderate post-glottic erythema and edema that is suggestive of laryngopharyngeal reflux. He was continued on acid suppression medicines, and also treated with antibiotics. The plan was to see him back this month with a CT of his sinuses. The patient is very frustrated about his persistent cough, and I have told him that we have done everything reasonable from a pulmonary standpoint.  He has not had a high resolution ct chest or bronchoscopy, but these are rarely helpful in chronic cough patients. If otolaryngology evaluation is not helpful, he could certainly consider an allergy evaluation. We could also do bronchoscopy but very low yield. At this point, would recommend referral to the voice disorders Center at [facility] for a multidisciplinary approach if not improved after otolaryngology evaluation.   Time spent with pt discussing the above was 56min.

## 2014-06-09 DIAGNOSIS — R05 Cough: Secondary | ICD-10-CM | POA: Diagnosis not present

## 2014-06-09 DIAGNOSIS — J329 Chronic sinusitis, unspecified: Secondary | ICD-10-CM | POA: Diagnosis not present

## 2014-06-09 DIAGNOSIS — J309 Allergic rhinitis, unspecified: Secondary | ICD-10-CM | POA: Diagnosis not present

## 2014-06-10 DIAGNOSIS — J310 Chronic rhinitis: Secondary | ICD-10-CM | POA: Diagnosis not present

## 2014-06-10 DIAGNOSIS — J387 Other diseases of larynx: Secondary | ICD-10-CM | POA: Diagnosis not present

## 2014-06-12 DIAGNOSIS — J329 Chronic sinusitis, unspecified: Secondary | ICD-10-CM | POA: Diagnosis not present

## 2014-06-23 DIAGNOSIS — J329 Chronic sinusitis, unspecified: Secondary | ICD-10-CM | POA: Diagnosis not present

## 2014-06-23 DIAGNOSIS — R05 Cough: Secondary | ICD-10-CM | POA: Diagnosis not present

## 2014-06-23 DIAGNOSIS — J331 Polypoid sinus degeneration: Secondary | ICD-10-CM | POA: Diagnosis not present

## 2014-06-23 DIAGNOSIS — J342 Deviated nasal septum: Secondary | ICD-10-CM | POA: Diagnosis not present

## 2014-06-24 ENCOUNTER — Other Ambulatory Visit: Payer: Self-pay | Admitting: Otolaryngology

## 2014-06-25 NOTE — Pre-Procedure Instructions (Signed)
Paul Daniels  06/25/2014   Your procedure is scheduled on:  Friday, February 5th  Report to [facility] Admitting at 530 AM.  Call this number if you have problems the morning of surgery: [phone]   Remember:   Do not eat food or drink liquids after midnight.   Take these medicines the morning of surgery with A SIP OF WATER: norvasc, prilosec, xanax if needed   Do not wear jewelry  Do not wear lotions, powders, or perfume, deodorant.  Do not shave 48 hours prior to surgery. Men may shave face and neck.  Do not bring valuables to the hospital.  [facility] is not responsible for any belongings or valuables.               Contacts, dentures or bridgework may not be worn into surgery.  Leave suitcase in the car. After surgery it may be brought to your room.  For patients admitted to the hospital, discharge time is determined by your  treatment team.               Patients discharged the day of surgery will not be allowed to drive home.  Please read over the following fact sheets that you were given: Pain Booklet, Coughing and Deep Breathing and Surgical Site Infection Prevention  [place] - Preparing for Surgery  Before surgery, you can play an important role.  Because skin is not sterile, your skin needs to be as free of germs as possible.  You can reduce the number of germs on you skin by washing with CHG (chlorahexidine gluconate) soap before surgery.  CHG is an antiseptic cleaner which kills germs and bonds with the skin to continue killing germs even after washing.  Please DO NOT use if you have an allergy to CHG or antibacterial soaps.  If your skin becomes reddened/irritated stop using the CHG and inform your nurse when you arrive at Short Stay.  Do not shave (including legs and underarms) for at least 48 hours prior to the first CHG shower.  You may shave your face.  Please follow these instructions carefully:   1.  Shower with CHG Soap the night before surgery  and the morning of Surgery.  2.  If you choose to wash your hair, wash your hair first as usual with your normal shampoo.  3.  After you shampoo, rinse your hair and body thoroughly to remove the shampoo.  4.  Use CHG as you would any other liquid soap.  You can apply CHG directly to the skin and wash gently with scrungie or a clean washcloth.  5.  Apply the CHG Soap to your body ONLY FROM THE NECK DOWN.  Do not use on open wounds or open sores.  Avoid contact with your eyes, ears, mouth and genitals (private parts).  Wash genitals (private parts) with your normal soap.  6.  Wash thoroughly, paying special attention to the area where your surgery will be performed.  7.  Thoroughly rinse your body with warm water from the neck down.  8.  DO NOT shower/wash with your normal soap after using and rinsing off the CHG Soap.  9.  Pat yourself dry with a clean towel.            10.  Wear clean pajamas.            11.  Place clean sheets on your bed the night of your first shower and do not sleep  with pets.  Day of Surgery  Do not apply any lotions/deoderants the morning of surgery.  Please wear clean clothes to the hospital/surgery center.

## 2014-06-26 ENCOUNTER — Encounter (HOSPITAL_COMMUNITY)
Admission: RE | Admit: 2014-06-26 | Discharge: 2014-06-26 | Disposition: A | Discharge status: Home / Self Care | Payer: Medicare Other | Source: Ambulatory Visit | Attending: Otolaryngology | Admitting: Otolaryngology

## 2014-06-26 ENCOUNTER — Encounter (HOSPITAL_COMMUNITY): Payer: Self-pay

## 2014-06-26 DIAGNOSIS — J370 Chronic laryngitis: Secondary | ICD-10-CM | POA: Diagnosis not present

## 2014-06-26 DIAGNOSIS — G4733 Obstructive sleep apnea (adult) (pediatric): Secondary | ICD-10-CM | POA: Insufficient documentation

## 2014-06-26 DIAGNOSIS — K219 Gastro-esophageal reflux disease without esophagitis: Secondary | ICD-10-CM | POA: Insufficient documentation

## 2014-06-26 DIAGNOSIS — I10 Essential (primary) hypertension: Secondary | ICD-10-CM | POA: Diagnosis not present

## 2014-06-26 DIAGNOSIS — E119 Type 2 diabetes mellitus without complications: Secondary | ICD-10-CM | POA: Insufficient documentation

## 2014-06-26 DIAGNOSIS — Z01812 Encounter for preprocedural laboratory examination: Secondary | ICD-10-CM | POA: Diagnosis not present

## 2014-06-26 DIAGNOSIS — E785 Hyperlipidemia, unspecified: Secondary | ICD-10-CM | POA: Diagnosis present

## 2014-06-26 DIAGNOSIS — I4510 Unspecified right bundle-branch block: Secondary | ICD-10-CM | POA: Diagnosis not present

## 2014-06-26 DIAGNOSIS — J309 Allergic rhinitis, unspecified: Secondary | ICD-10-CM | POA: Diagnosis not present

## 2014-06-26 HISTORY — DX: Gastro-esophageal reflux disease without esophagitis: K21.9

## 2014-06-26 LAB — CBC
HEMATOCRIT: 39.9 % (ref 39.0–52.0)
HEMOGLOBIN: 14.2 g/dL (ref 13.0–17.0)
MCH: 32.9 pg (ref 26.0–34.0)
MCHC: 35.6 g/dL (ref 30.0–36.0)
MCV: 92.6 fL (ref 78.0–100.0)
Platelets: 138 10*3/uL — ABNORMAL LOW (ref 150–400)
RBC: 4.31 MIL/uL (ref 4.22–5.81)
RDW: 12.9 % (ref 11.5–15.5)
WBC: 5.6 10*3/uL (ref 4.0–10.5)

## 2014-06-26 LAB — COMPREHENSIVE METABOLIC PANEL
ALBUMIN: 3.9 g/dL (ref 3.5–5.2)
ALT: 50 U/L (ref 0–53)
ANION GAP: 4 — AB (ref 5–15)
AST: 42 U/L — AB (ref 0–37)
Alkaline Phosphatase: 50 U/L (ref 39–117)
BUN: 17 mg/dL (ref 6–23)
CALCIUM: 9.5 mg/dL (ref 8.4–10.5)
CO2: 31 mmol/L (ref 19–32)
Chloride: 100 mmol/L (ref 96–112)
Creatinine, Ser: 1.09 mg/dL (ref 0.50–1.35)
GFR calc Af Amer: 79 mL/min — ABNORMAL LOW (ref >90)
GFR calc non Af Amer: 68 mL/min — ABNORMAL LOW (ref >90)
GLUCOSE: 139 mg/dL — AB (ref 70–99)
Potassium: 3.9 mmol/L (ref 3.5–5.1)
Sodium: 135 mmol/L (ref 135–145)
Total Bilirubin: 0.6 mg/dL (ref 0.3–1.2)
Total Protein: 7.3 g/dL (ref 6.0–8.3)

## 2014-06-26 LAB — GLUCOSE, CAPILLARY: GLUCOSE-CAPILLARY: 157 mg/dL — AB (ref 70–99)

## 2014-06-26 NOTE — Progress Notes (Signed)
Primary - dr. Inda Merlin No cardiologist No recent cardio workup

## 2014-06-26 NOTE — Progress Notes (Signed)
Anesthesia Chart Review:  Pt is 69 year old male scheduled for B endoscopic sinus surgery, nasal polypectomy with fusion scan on 07/03/2014 with Dr. Wilburn Cornelia.   PMH includes: HTN, DM, hyperlipidemia, OSA, GERD. BMI 30  Preoperative labs reviewed.  Platelets 138. Glucose 139.   Chest x-ray 04/06/2014 reviewed. There is no edema, consolidation, effusion, or pneumothorax.   EKG: NSR. Left axis deviation. RBBB. Moderate voltage criteria for LVH, may be normal variant.   Discussed with Dr. Tobias Alexander.   If no changes, I anticipate pt can proceed with surgery as scheduled.   Willeen Cass, FNP-BC [facility] Short Stay Surgical Center/Anesthesiology Phone: [phone] 06/26/2014 4:40 PM

## 2014-06-27 LAB — HEMOGLOBIN A1C
HEMOGLOBIN A1C: 7.7 % — AB (ref 4.8–5.6)
Mean Plasma Glucose: 174 mg/dL

## 2014-07-02 MED ORDER — DEXAMETHASONE SODIUM PHOSPHATE 10 MG/ML IJ SOLN
10.0000 mg | Freq: Once | INTRAMUSCULAR | Status: AC
  Administered 2014-07-03: 10 mg via INTRAVENOUS
  Filled 2014-07-02: qty 1

## 2014-07-02 MED ORDER — CEFAZOLIN SODIUM-DEXTROSE 2-3 GM-% IV SOLR
2.0000 g | INTRAVENOUS | Status: AC
  Administered 2014-07-03: 2 g via INTRAVENOUS
  Filled 2014-07-02: qty 50

## 2014-07-03 ENCOUNTER — Ambulatory Visit (HOSPITAL_COMMUNITY)
Admission: RE | Admit: 2014-07-03 | Discharge: 2014-07-03 | Disposition: A | Discharge status: Home / Self Care | Payer: Medicare Other | Source: Ambulatory Visit | Attending: Otolaryngology | Admitting: Otolaryngology

## 2014-07-03 ENCOUNTER — Encounter (HOSPITAL_COMMUNITY)
Admission: RE | Disposition: A | Discharge status: Home / Self Care | Payer: Self-pay | Source: Ambulatory Visit | Attending: Otolaryngology

## 2014-07-03 ENCOUNTER — Encounter (HOSPITAL_COMMUNITY): Payer: Self-pay | Admitting: *Deleted

## 2014-07-03 ENCOUNTER — Ambulatory Visit (HOSPITAL_COMMUNITY): Payer: Medicare Other | Admitting: Emergency Medicine

## 2014-07-03 ENCOUNTER — Ambulatory Visit (HOSPITAL_COMMUNITY): Payer: Medicare Other | Admitting: Anesthesiology

## 2014-07-03 DIAGNOSIS — J329 Chronic sinusitis, unspecified: Secondary | ICD-10-CM | POA: Diagnosis not present

## 2014-07-03 DIAGNOSIS — G4700 Insomnia, unspecified: Secondary | ICD-10-CM | POA: Insufficient documentation

## 2014-07-03 DIAGNOSIS — J343 Hypertrophy of nasal turbinates: Secondary | ICD-10-CM | POA: Insufficient documentation

## 2014-07-03 DIAGNOSIS — E876 Hypokalemia: Secondary | ICD-10-CM | POA: Insufficient documentation

## 2014-07-03 DIAGNOSIS — J320 Chronic maxillary sinusitis: Secondary | ICD-10-CM | POA: Diagnosis not present

## 2014-07-03 DIAGNOSIS — E119 Type 2 diabetes mellitus without complications: Secondary | ICD-10-CM | POA: Insufficient documentation

## 2014-07-03 DIAGNOSIS — J321 Chronic frontal sinusitis: Secondary | ICD-10-CM | POA: Diagnosis not present

## 2014-07-03 DIAGNOSIS — J342 Deviated nasal septum: Secondary | ICD-10-CM | POA: Diagnosis not present

## 2014-07-03 DIAGNOSIS — F419 Anxiety disorder, unspecified: Secondary | ICD-10-CM | POA: Diagnosis not present

## 2014-07-03 DIAGNOSIS — E785 Hyperlipidemia, unspecified: Secondary | ICD-10-CM | POA: Insufficient documentation

## 2014-07-03 DIAGNOSIS — K5190 Ulcerative colitis, unspecified, without complications: Secondary | ICD-10-CM | POA: Diagnosis not present

## 2014-07-03 DIAGNOSIS — I10 Essential (primary) hypertension: Secondary | ICD-10-CM | POA: Diagnosis not present

## 2014-07-03 DIAGNOSIS — F329 Major depressive disorder, single episode, unspecified: Secondary | ICD-10-CM | POA: Diagnosis not present

## 2014-07-03 DIAGNOSIS — Z886 Allergy status to analgesic agent status: Secondary | ICD-10-CM | POA: Diagnosis not present

## 2014-07-03 DIAGNOSIS — K219 Gastro-esophageal reflux disease without esophagitis: Secondary | ICD-10-CM | POA: Diagnosis not present

## 2014-07-03 DIAGNOSIS — Z79899 Other long term (current) drug therapy: Secondary | ICD-10-CM | POA: Diagnosis not present

## 2014-07-03 DIAGNOSIS — N400 Enlarged prostate without lower urinary tract symptoms: Secondary | ICD-10-CM | POA: Insufficient documentation

## 2014-07-03 DIAGNOSIS — M170 Bilateral primary osteoarthritis of knee: Secondary | ICD-10-CM | POA: Insufficient documentation

## 2014-07-03 DIAGNOSIS — G4733 Obstructive sleep apnea (adult) (pediatric): Secondary | ICD-10-CM | POA: Insufficient documentation

## 2014-07-03 DIAGNOSIS — J322 Chronic ethmoidal sinusitis: Secondary | ICD-10-CM | POA: Diagnosis not present

## 2014-07-03 DIAGNOSIS — Z7982 Long term (current) use of aspirin: Secondary | ICD-10-CM | POA: Diagnosis not present

## 2014-07-03 DIAGNOSIS — E669 Obesity, unspecified: Secondary | ICD-10-CM | POA: Diagnosis not present

## 2014-07-03 HISTORY — PX: SINUS ENDO W/FUSION: SHX777

## 2014-07-03 LAB — GLUCOSE, CAPILLARY
Glucose-Capillary: 173 mg/dL — ABNORMAL HIGH (ref 70–99)
Glucose-Capillary: 224 mg/dL — ABNORMAL HIGH (ref 70–99)

## 2014-07-03 SURGERY — SINUS SURGERY, ENDOSCOPIC, USING COMPUTER-ASSISTED NAVIGATION
Anesthesia: General | Site: Nose | Laterality: Bilateral

## 2014-07-03 MED ORDER — LIDOCAINE-EPINEPHRINE 1 %-1:100000 IJ SOLN
INTRAMUSCULAR | Status: DC | PRN
  Administered 2014-07-03: 20 mL

## 2014-07-03 MED ORDER — LACTATED RINGERS IV SOLN
INTRAVENOUS | Status: DC | PRN
  Administered 2014-07-03 (×2): via INTRAVENOUS

## 2014-07-03 MED ORDER — ARTIFICIAL TEARS OP OINT
TOPICAL_OINTMENT | OPHTHALMIC | Status: AC
Start: 2014-07-03 — End: 2014-07-03
  Filled 2014-07-03: qty 3.5

## 2014-07-03 MED ORDER — ONDANSETRON HCL 4 MG/2ML IJ SOLN
INTRAMUSCULAR | Status: DC | PRN
  Administered 2014-07-03: 4 mg via INTRAVENOUS

## 2014-07-03 MED ORDER — MOXIFLOXACIN HCL 400 MG PO TABS: 400.0000 mg | ORAL_TABLET | Freq: Every day | ORAL | Status: DC

## 2014-07-03 MED ORDER — MIDAZOLAM HCL 5 MG/5ML IJ SOLN
INTRAMUSCULAR | Status: DC | PRN
  Administered 2014-07-03: 2 mg via INTRAVENOUS

## 2014-07-03 MED ORDER — PROPOFOL 10 MG/ML IV BOLUS
INTRAVENOUS | Status: AC
  Filled 2014-07-03: qty 20

## 2014-07-03 MED ORDER — NEOSTIGMINE METHYLSULFATE 10 MG/10ML IV SOLN
INTRAVENOUS | Status: AC
  Filled 2014-07-03: qty 1

## 2014-07-03 MED ORDER — OXYMETAZOLINE HCL 0.05 % NA SOLN
NASAL | Status: AC
  Filled 2014-07-03: qty 15

## 2014-07-03 MED ORDER — EPHEDRINE SULFATE 50 MG/ML IJ SOLN
INTRAMUSCULAR | Status: DC | PRN
  Administered 2014-07-03: 10 mg via INTRAVENOUS

## 2014-07-03 MED ORDER — TRIAMCINOLONE ACETONIDE 40 MG/ML IJ SUSP
INTRAMUSCULAR | Status: AC
  Filled 2014-07-03: qty 5

## 2014-07-03 MED ORDER — ROCURONIUM BROMIDE 50 MG/5ML IV SOLN
INTRAVENOUS | Status: AC
  Filled 2014-07-03: qty 1

## 2014-07-03 MED ORDER — ROCURONIUM BROMIDE 100 MG/10ML IV SOLN
INTRAVENOUS | Status: DC | PRN
  Administered 2014-07-03: 50 mg via INTRAVENOUS

## 2014-07-03 MED ORDER — MUPIROCIN CALCIUM 2 % EX CREA
TOPICAL_CREAM | CUTANEOUS | Status: AC
  Filled 2014-07-03: qty 15

## 2014-07-03 MED ORDER — EPHEDRINE SULFATE 50 MG/ML IJ SOLN
INTRAMUSCULAR | Status: AC
  Filled 2014-07-03: qty 1

## 2014-07-03 MED ORDER — TRIAMCINOLONE ACETONIDE 40 MG/ML IJ SUSP
INTRAMUSCULAR | Status: DC | PRN
  Administered 2014-07-03: 40 mg

## 2014-07-03 MED ORDER — HYDROCODONE-ACETAMINOPHEN 5-325 MG PO TABS: 1.0000 | ORAL_TABLET | Freq: Four times a day (QID) | ORAL | Status: DC | PRN

## 2014-07-03 MED ORDER — GLYCOPYRROLATE 0.2 MG/ML IJ SOLN
INTRAMUSCULAR | Status: DC | PRN
  Administered 2014-07-03: 0.6 mg via INTRAVENOUS

## 2014-07-03 MED ORDER — MUPIROCIN CALCIUM 2 % EX CREA
TOPICAL_CREAM | CUTANEOUS | Status: DC | PRN
  Administered 2014-07-03: 1 via TOPICAL

## 2014-07-03 MED ORDER — 0.9 % SODIUM CHLORIDE (POUR BTL) OPTIME
TOPICAL | Status: DC | PRN
  Administered 2014-07-03: 1000 mL

## 2014-07-03 MED ORDER — SODIUM CHLORIDE 0.9 % IJ SOLN
INTRAMUSCULAR | Status: AC
  Filled 2014-07-03: qty 10

## 2014-07-03 MED ORDER — ARTIFICIAL TEARS OP OINT
TOPICAL_OINTMENT | OPHTHALMIC | Status: DC | PRN
  Administered 2014-07-03: 1 via OPHTHALMIC

## 2014-07-03 MED ORDER — SUCCINYLCHOLINE CHLORIDE 20 MG/ML IJ SOLN
INTRAMUSCULAR | Status: AC
  Filled 2014-07-03: qty 1

## 2014-07-03 MED ORDER — LIDOCAINE HCL (CARDIAC) 20 MG/ML IV SOLN
INTRAVENOUS | Status: AC
  Filled 2014-07-03: qty 5

## 2014-07-03 MED ORDER — LIDOCAINE HCL (CARDIAC) 20 MG/ML IV SOLN
INTRAVENOUS | Status: DC | PRN
  Administered 2014-07-03: 100 mg via INTRAVENOUS

## 2014-07-03 MED ORDER — LIDOCAINE-EPINEPHRINE 1 %-1:100000 IJ SOLN
INTRAMUSCULAR | Status: AC
  Filled 2014-07-03: qty 1

## 2014-07-03 MED ORDER — FENTANYL CITRATE 0.05 MG/ML IJ SOLN
INTRAMUSCULAR | Status: AC
  Filled 2014-07-03: qty 5

## 2014-07-03 MED ORDER — NEOSTIGMINE METHYLSULFATE 10 MG/10ML IV SOLN
INTRAVENOUS | Status: DC | PRN
  Administered 2014-07-03: 4 mg via INTRAVENOUS

## 2014-07-03 MED ORDER — GLYCOPYRROLATE 0.2 MG/ML IJ SOLN
INTRAMUSCULAR | Status: AC
  Filled 2014-07-03: qty 3

## 2014-07-03 MED ORDER — PROPOFOL 10 MG/ML IV BOLUS
INTRAVENOUS | Status: DC | PRN
  Administered 2014-07-03: 30 mg via INTRAVENOUS
  Administered 2014-07-03: 170 mg via INTRAVENOUS

## 2014-07-03 MED ORDER — SODIUM CHLORIDE 0.9 % IR SOLN
Status: DC | PRN
  Administered 2014-07-03: 1000 mL

## 2014-07-03 MED ORDER — OXYMETAZOLINE HCL 0.05 % NA SOLN
NASAL | Status: DC | PRN
Start: 2014-07-03 — End: 2014-07-03
  Administered 2014-07-03: 1 via NASAL

## 2014-07-03 MED ORDER — MEPERIDINE HCL 25 MG/ML IJ SOLN: 6.2500 mg | INTRAMUSCULAR | Status: DC | PRN

## 2014-07-03 MED ORDER — MIDAZOLAM HCL 2 MG/2ML IJ SOLN
INTRAMUSCULAR | Status: AC
  Filled 2014-07-03: qty 2

## 2014-07-03 MED ORDER — ONDANSETRON HCL 4 MG/2ML IJ SOLN
INTRAMUSCULAR | Status: AC
  Filled 2014-07-03: qty 2

## 2014-07-03 MED ORDER — FENTANYL CITRATE 0.05 MG/ML IJ SOLN: 25.0000 ug | INTRAMUSCULAR | Status: DC | PRN

## 2014-07-03 MED ORDER — PROMETHAZINE HCL 25 MG/ML IJ SOLN: 6.2500 mg | INTRAMUSCULAR | Status: DC | PRN

## 2014-07-03 MED ORDER — FENTANYL CITRATE 0.05 MG/ML IJ SOLN
INTRAMUSCULAR | Status: DC | PRN
  Administered 2014-07-03 (×5): 50 ug via INTRAVENOUS

## 2014-07-03 SURGICAL SUPPLY — 47 items
BLADE ROTATE RAD 40 4 M4 (BLADE) ×1 IMPLANT
BLADE ROTATE TRICUT 4X13 M4 (BLADE) ×2 IMPLANT
CANISTER SUCTION 2500CC (MISCELLANEOUS) ×4 IMPLANT
COAGULATOR SUCT SWTCH 10FR 6 (ELECTROSURGICAL) ×1 IMPLANT
CONT SPEC 4OZ CLIKSEAL STRL BL (MISCELLANEOUS) ×1 IMPLANT
DRAPE PROXIMA HALF (DRAPES) ×1 IMPLANT
DRESSING NASAL KENNEDY 3.5X.9 (MISCELLANEOUS) IMPLANT
DRSG NASAL KENNEDY 3.5X.9 (MISCELLANEOUS) ×2
ELECT COATED BLADE 2.86 ST (ELECTRODE) IMPLANT
ELECT REM PT RETURN 9FT ADLT (ELECTROSURGICAL) ×2
ELECTRODE REM PT RTRN 9FT ADLT (ELECTROSURGICAL) ×1 IMPLANT
FILTER ARTHROSCOPY CONVERTOR (FILTER) IMPLANT
GLOVE BIOGEL M 7.0 STRL (GLOVE) ×6 IMPLANT
GLOVE BIOGEL PI IND STRL 8 (GLOVE) IMPLANT
GLOVE BIOGEL PI INDICATOR 8 (GLOVE) ×1
GLOVE SURG SS PI 6.5 STRL IVOR (GLOVE) ×2 IMPLANT
GLOVE SURG SS PI 7.5 STRL IVOR (GLOVE) ×1 IMPLANT
GOWN STRL REUS W/ TWL LRG LVL3 (GOWN DISPOSABLE) ×1 IMPLANT
GOWN STRL REUS W/TWL LRG LVL3 (GOWN DISPOSABLE) ×8
KIT BASIN OR (CUSTOM PROCEDURE TRAY) ×2 IMPLANT
KIT ROOM TURNOVER OR (KITS) ×2 IMPLANT
NDL 18GX1X1/2 (RX/OR ONLY) (NEEDLE) IMPLANT
NEEDLE 18GX1X1/2 (RX/OR ONLY) (NEEDLE) ×2 IMPLANT
NEEDLE 27GAX1/2IN MONOJET (NEEDLE) ×1 IMPLANT
NEEDLE 27GAX1X1/2 (NEEDLE) ×1 IMPLANT
NS IRRIG 1000ML POUR BTL (IV SOLUTION) ×2 IMPLANT
PAD ARMBOARD 7.5X6 YLW CONV (MISCELLANEOUS) ×4 IMPLANT
PAD ENT ADHESIVE 25PK (MISCELLANEOUS) ×2 IMPLANT
PENCIL BUTTON HOLSTER BLD 10FT (ELECTRODE) ×1 IMPLANT
SPECIMEN JAR SMALL (MISCELLANEOUS) ×1 IMPLANT
SPLINT NASAL DOYLE BI-VL (GAUZE/BANDAGES/DRESSINGS) ×1 IMPLANT
SPONGE NEURO XRAY DETECT 1X3 (DISPOSABLE) ×2 IMPLANT
SUT ETHILON 3 0 FSL (SUTURE) IMPLANT
SUT PLAIN 4 0 ~~LOC~~ 1 (SUTURE) IMPLANT
SWAB COLLECTION DEVICE MRSA (MISCELLANEOUS) ×2 IMPLANT
SYR CONTROL 10ML LL (SYRINGE) ×1 IMPLANT
TOWEL OR 17X24 6PK STRL BLUE (TOWEL DISPOSABLE) ×2 IMPLANT
TOWEL OR 17X26 10 PK STRL BLUE (TOWEL DISPOSABLE) ×2 IMPLANT
TRACKER ENT INSTRUMENT (MISCELLANEOUS) ×2 IMPLANT
TRACKER ENT PATIENT (MISCELLANEOUS) ×2 IMPLANT
TRAP SPECIMEN MUCOUS 40CC (MISCELLANEOUS) ×1 IMPLANT
TRAY ENT MC OR (CUSTOM PROCEDURE TRAY) ×2 IMPLANT
TUBE ANAEROBIC SPECIMEN COL (MISCELLANEOUS) ×1 IMPLANT
TUBE CONNECTING 12X1/4 (SUCTIONS) ×2 IMPLANT
TUBING EXTENTION W/L.L. (IV SETS) ×2 IMPLANT
TUBING STRAIGHTSHOT EPS 5PK (TUBING) ×3 IMPLANT
WIPE INSTRUMENT VISIWIPE 73X73 (MISCELLANEOUS) ×2 IMPLANT

## 2014-07-03 NOTE — Anesthesia Postprocedure Evaluation (Signed)
  Anesthesia Post-op Note  Patient: Paul Daniels  Procedure(s) Performed: Procedure(s): BILATERAL ENDOSCOPIC SINUS SURGERY NASAL POLYPECTOMY WITH FUSION SCAN  (Bilateral)  Patient Location:    Anesthesia Type:General  Level of Consciousness: awake and alert   Airway and Oxygen Therapy: Patient Spontanous Breathing and Patient connected to nasal cannula oxygen  Post-op Pain: mild  Post-op Assessment: Post-op Vital signs reviewed, Patient's Cardiovascular Status Stable, Respiratory Function Stable, Patent Airway and No signs of Nausea or vomiting  Post-op Vital Signs: Reviewed and stable  Last Vitals:  Filed Vitals:   07/03/14 1043  BP: 137/82  Pulse: 76  Temp: 36.4 C  Resp: 14    Complications: No apparent anesthesia complications

## 2014-07-03 NOTE — Brief Op Note (Signed)
07/03/2014  10:32 AM  PATIENT:  Paul Daniels  69 y.o. male  PRE-OPERATIVE DIAGNOSIS:  CHRONIC SINUSITIS   POST-OPERATIVE DIAGNOSIS:  CHRONIC SINUSITIS   PROCEDURE:  Procedure(s): BILATERAL ENDOSCOPIC SINUS SURGERY NASAL POLYPECTOMY WITH FUSION SCAN  (Bilateral)  SURGEON:  Surgeon(s) and Role:    * Jerrell Belfast, MD - Primary  PHYSICIAN ASSISTANT:   ASSISTANTS: none   ANESTHESIA:   general  EBL:  Total I/O In: 1500 [I.V.:1500] Out: 400 [Blood:400]  BLOOD ADMINISTERED:none  DRAINS: none   LOCAL MEDICATIONS USED:  LIDOCAINE  and Amount: 8 ml  SPECIMEN:  Source of Specimen:  sinus contents  DISPOSITION OF SPECIMEN:  PATHOLOGY  COUNTS:  YES  TOURNIQUET:  * No tourniquets in log *  DICTATION: .Other Dictation: Dictation Number [id number]  PLAN OF CARE: Discharge to home after PACU  PATIENT DISPOSITION:  PACU - hemodynamically stable.   Delay start of Pharmacological VTE agent (>24hrs) due to surgical blood loss or risk of bleeding: yes

## 2014-07-03 NOTE — H&P (Signed)
Paul Daniels is an 69 y.o. male.   Chief Complaint: Nasal Obstruction HPI: Septal deviation and chronic sinus polyps  Past Medical History  Diagnosis Date  . Ulcerative colitis     no recent flares  . Insomnia   . Obesity   . BPH (benign prostatic hyperplasia)   . Anxiety   . Hypokalemia   . Hypertension   . Hyperlipidemia   . Diabetes mellitus, type 2   . OSA (obstructive sleep apnea) yrs ago    borderline, did not like cpap  . DJD (degenerative joint disease)     bilateral knee's  . Depression   . GERD (gastroesophageal reflux disease)     "silent reflux"    Past Surgical History  Procedure Laterality Date  . Cystoscopy  08/10/2011    Procedure: CYSTOSCOPY;  Surgeon: Dutch Gray, MD;  Location: [facility];  Service: Urology;  Laterality: N/A;  . Transurethral resection of prostate  08/10/2011    Procedure: TRANSURETHRAL RESECTION OF THE PROSTATE (TURP);  Surgeon: Dutch Gray, MD;  Location: [facility];  Service: Urology;  Laterality: N/A;  . Inguinal hernia repair  1994  . Cholecystectomy  1998  . Colonoscopy with propofol N/A 03/18/2013    Procedure: COLONOSCOPY WITH PROPOFOL;  Surgeon: Garlan Fair, MD;  Location: [facility];  Service: Endoscopy;  Laterality: N/A;    Family History  Problem Relation Age of Onset  . Asthma Maternal Uncle     uncle  . Heart attack Father    Social History:  reports that he has never smoked. He has never used smokeless tobacco. He reports that he drinks about 11.4 oz of alcohol per week. He reports that he does not use illicit drugs.  Allergies:  Allergies  Allergen Reactions  . Acetaminophen     Alcohol consumer-so does not want to use    Medications Prior to Admission  Medication Sig Dispense Refill  . ALPRAZolam (XANAX) 1 MG tablet Take 1-2 mg by mouth at bedtime as needed for anxiety.    Marland Kitchen amLODipine (NORVASC) 2.5 MG tablet Take 7.5 mg by mouth daily.    Marland Kitchen aspirin EC 81 MG tablet Take 162 mg by mouth daily.    . Coenzyme Q10  (CO Q-10) 120 MG CAPS Take 120 mg by mouth daily.    Marland Kitchen FISH OIL-KRILL OIL PO Take 1 capsule by mouth 2 (two) times daily.     Marland Kitchen losartan-hydrochlorothiazide (HYZAAR) 100-12.5 MG per tablet Take 1 tablet by mouth every morning.    . metFORMIN (GLUCOPHAGE) 500 MG tablet Take 500 mg by mouth 2 (two) times daily with a meal.    . montelukast (SINGULAIR) 10 MG tablet Take 10 mg by mouth daily.    Marland Kitchen omeprazole (PRILOSEC) 40 MG capsule TAKE 1 CAPSULE 30 MINUTES BEFORE DINNER ON AN EMPTY STOMACH. 30 capsule 5  . potassium chloride SA (KLOR-CON M20) 20 MEQ tablet Take 20 mEq by mouth every evening.     . ranitidine (ZANTAC) 300 MG capsule Take 300 mg by mouth every evening.    . zolpidem (AMBIEN) 10 MG tablet Take 10 mg by mouth at bedtime as needed for sleep.       Results for orders placed or performed during the hospital encounter of 07/03/14 (from the past 48 hour(s))  Glucose, capillary     Status: Abnormal   Collection Time: 07/03/14  6:32 AM  Result Value Ref Range   Glucose-Capillary 173 (H) 70 - 99 mg/dL   Comment 1  Notify RN    No results found.  Review of Systems  Constitutional: Negative.   HENT: Negative.   Respiratory: Negative.   Cardiovascular: Negative.   Gastrointestinal: Negative.     Blood pressure 141/97, pulse 62, temperature 98.4 F (36.9 C), temperature source Oral, resp. rate 20, height 5\' 9"  (1.753 m), weight 93.526 kg (206 lb 3 oz), SpO2 96 %. Physical Exam  Constitutional: He is oriented to person, place, and time. He appears well-developed and well-nourished.  Neck: Normal range of motion. Neck supple.  Cardiovascular: Normal rate.   Respiratory: Effort normal.  GI: Soft.  Musculoskeletal: Normal range of motion.  Neurological: He is alert and oriented to person, place, and time.     Assessment/Plan Adm for ESS and nasal surgery.  [place], Kristell Wooding 07/03/2014, 7:34 AM

## 2014-07-03 NOTE — Anesthesia Preprocedure Evaluation (Signed)
Anesthesia Evaluation  Patient identified by MRN, date of birth, ID band Patient awake    Reviewed: Allergy & Precautions, NPO status , Patient's Chart, lab work & pertinent test results, reviewed documented beta blocker date and time   Airway Mallampati: II   Neck ROM: Full    Dental  (+) Dental Advisory Given   Pulmonary  breath sounds clear to auscultation        Cardiovascular hypertension, Pt. on medications Rhythm:Regular     Neuro/Psych Anxiety Depression    GI/Hepatic   Endo/Other  diabetes, Type 2  Renal/GU      Musculoskeletal   Abdominal (+)  Abdomen: soft.    Peds  Hematology   Anesthesia Other Findings   Reproductive/Obstetrics                             Anesthesia Physical Anesthesia Plan  ASA: II  Anesthesia Plan: General   Post-op Pain Management:    Induction: Intravenous  Airway Management Planned: Oral ETT  Additional Equipment:   Intra-op Plan:   Post-operative Plan: Extubation in OR  Informed Consent: I have reviewed the patients History and Physical, chart, labs and discussed the procedure including the risks, benefits and alternatives for the proposed anesthesia with the patient or authorized representative who has indicated his/her understanding and acceptance.     Plan Discussed with:   Anesthesia Plan Comments:         Anesthesia Quick Evaluation

## 2014-07-03 NOTE — Op Note (Signed)
Paul Daniels, Paul Daniels                ACCOUNT NO.:  [id number]  MEDICAL RECORD NO.:  [id number]  LOCATION:  [facility]                         FACILITY:  [facility]  PHYSICIAN:  Early Chars. Wilburn Cornelia, M.D.DATE OF BIRTH:  September 08, 1945  DATE OF PROCEDURE:  07/03/2014 DATE OF DISCHARGE:  07/03/2014                              OPERATIVE REPORT   PREOPERATIVE DIAGNOSES: 1. Chronic sinusitis with nasal polyposis. 2. Nasal septal deviation. 3. Inferior turbinate hypertrophy.  POSTOPERATIVE DIAGNOSES: 1. Chronic sinusitis with nasal polyposis. 2. Nasal septal deviation. 3. Inferior turbinate hypertrophy.  INDICATIONS FOR SURGERY: 1. Chronic sinusitis with nasal polyposis. 2. Nasal septal deviation. 3. Inferior turbinate hypertrophy.  PROCEDURE: 1. Bilateral endoscopic sinus surgery with intraoperative computer-     assisted navigation (Xomed FUSION) consisting of bilateral total     ethmoidectomy, bilateral maxillary antrostomy with removal of     diseased tissue and bilateral nasal frontal recess exploration. 2. Nasal septoplasty. 3. Inferior turbinate reduction.  ANESTHESIA:  General endotracheal.  SURGEON:  Early Chars. Wilburn Cornelia, MD  COMPLICATIONS:  None.  ESTIMATED BLOOD LOSS:  Approximately 300 mL.  Patient transferred from the operating room to the recovery room in stable condition.  FINDINGS:  Extensive sinonasal polyps with sinus obstruction, fungal disease in the right maxillary sinus, chronic-appearing mucopurulent disease in the left maxillary sinus, culture and sensitivity obtained. Kenalog, Bactroban slurry placed bilaterally.  Bilateral Kennedy Sinus packs and nasal septal splints placed at the conclusion of the surgical procedure.  BRIEF HISTORY:  The patient is a 69 year old male who was referred by his pulmonologist, Dr. Gwenette Greet for evaluation of chronic sinusitis and nasal airway obstruction.  The patient has had progressive symptoms of nasal blockage with mild COPD and  pulmonary dysfunction.  Despite appropriate medical therapy, the pulmonologist were unable to adequately stabilize his breathing issues and the patient was scanned and referred to our office for evaluation of chronic-appearing sinus disease.  The patient was treated with multiple courses of broad-spectrum antibiotics. Oral and topical steroids and saline nasal spray and despite aggressive and appropriate medical therapy, continued to have symptoms of nasal airway obstruction.  The followup CT scan was obtained which showed a severe left nasal septal deviation with turbinate hypertrophy and opacification of the maxillary ethmoid and frontal sinuses bilaterally with soft tissue disease consistent with chronic nasal polyposis.  Given the patient's history, examination, and failure to respond to appropriate medical therapy, I recommended the above surgical procedures.  The risks and benefits of each procedure were discussed in detail with the patient's wife and they understood and concurred with our plan for surgery which is scheduled on elective basis under general anesthesia at [facility].  DESCRIPTION OF PROCEDURE:  The patient was brought to the operating room on July 03, 2014, and placed in supine position on the operating table.  General endotracheal anesthesia was established without difficulty.  When the patient was adequately anesthetized, he was positioned and prepped and draped in a sterile fashion.  His nose was injected with a total of 8 mL of 1% lidocaine, 1:100,000 solution of epinephrine injected in submucosal fashion along the lateral nasal wall, middle turbinate, uncinate process, nasal septum, and inferior  turbinates bilaterally.  The patient's nose was then packed with Afrin- soaked cottonoid pledgets and were left in place for approximately 10 minutes to allow for vasoconstriction hemostasis.  The Xomed FUSION headgear was applied and anatomic and  surgical landmarks were identified and confirmed.  The fusion navigation device was used throughout the sinus component of the surgical procedure.  With the patient positioned prepped and draped, the surgical procedure was begun.  Began on the right-hand side using a 0-degree endoscope, nasal endoscopy was performed.  There was polypoid material within the middle meatus which was resected with a straight microdebrider.  Dissection was carried from anterior to posterior along the floor of the ethmoid sinus removing polypoid disease.  The uncinate process reflected anteriorly and resected with through-cutting forceps.  Dissection was then carried out to complete the ethmoidectomy from anterior to posterior, posterior- superior ethmoid air cells were identified and a 45 degree telescope and curved microdebrider with navigation were used from posterior to anterior along the roof of the ethmoid into the nasal frontal recess. There was significant polypoid disease and obstruction of the frontal outflow tract which was widely enlarged and underlying ethmoid and frontal disease was cleared.  Attention was then turned to the lateral nasal wall where the natural ostium of the maxillary sinus was occluded with polypoid disease, this was resected with a curved microdebrider and through cutting forceps.  Within the right maxillary sinus, the patient was found to have a large benign-appearing fungus ball which was irrigated and completely cleared.  Polypoid mucosa was resected.  Septoplasty was then performed and anterior hemitransfixion incision was created in the right septum, this was carried through the mucosa and underlying submucosa and mucoperichondrial flap was elevated from anterior to posterior on the right hand side.  Cartilage septum was crossed and a mucoperichondrial flap was elevated on the left.  The mid septal cartilage was removed.  This was later morselized and returned to the  mucoperichondrial pocket.  Dissection was then carried from anterior to posterior removing deviated bone and cartilage, and bringing the septum to a good midline position.  At the conclusion of the surgical procedure, the morselized cartilage was returned to the mucoperichondrial pocket and the flaps were reapproximated with a 4-0 gut suture on a Keith needle in a horizontal mattress fashion.  Hemitransfixion incision was closed with the same stitch and Doyle nasal septal splints were placed bilaterally after the application of Bactroban ointment and sutured into position with a 3-0 Ethilon suture.  Attention was then turned to the left hand side with the septum brought to the middle of 0 degree.  Endoscope was introduced in left nostril and endoscopy was performed.  There was significant polypoid disease and purulent discharge within the middle meatus which was resected with a straight microdebrider.  Dissection was carried from anterior to posterior and a total ethmoidectomy was performed.  The posterior superior ethmoid air cells were identified and a curved microdebrider and 45 degree telescope were used along the roof of the ethmoid to resect polypoid disease and bony septations.  The nasal frontal recess was completely occluded with polyps.  The patient had a type 3 midline accessory frontal sinus which was opened and the common wall was removed to create a widely patent frontal sinus outflow.  The natural ostium of the frontal sinus was identified and again polypoid material was resected with a curved microdebrider.  Attention was turned to the lateral nasal wall where the residual uncinate process was resected. Natural  ostium of the maxillary sinus was identified and enlarged. There was significant polypoid disease within the maxillary sinus as well as thick mucopurulent material which was aspirated and sent for culture and sensitivity.  The ostium was widely enlarged and  intrasinus polyps were resected with a curved microdebrider.  Inferior turbinate reduction was then performed with cautery set at 12 watts.  Two submucosal passes were made in each inferior turbinate. Anterior incisions were created, overlying soft tissue, elevated, and turbinate bone was then resected.  The turbinates were outfractured, and appeared  more patent nasal cavity.  Patient's nasal cavity was thoroughly inspected.  Surgical debris was cleared.  Sponge count was correct.  There was no active bleeding.  A 50:50 mixture of Kenalog 40 and Bactroban ointment was used to make a slurry which was instilled in the frontal ethmoid and maxillary sinuses bilaterally.  Bilateral Kennedy Sinus packs were then placed in the common ethmoid cavity.  Nasopharynx was suctioned.  Orogastric tube was passed.  Stomach contents were aspirated.  The patient was awakened from his anesthetic.  He was then extubated and transferred from the operating room to recovery room in stable condition.  No complications. Estimated blood loss approximately 300 mL.          ______________________________ Early Chars. Wilburn Cornelia, M.D.     DLS/MEDQ  D:  85/06/7739  T:  07/03/2014  Job:  [id number]

## 2014-07-03 NOTE — Discharge Instructions (Signed)
°What to eat: ° °For your first meals, you should eat lightly; only small meals initially.  If you do not have nausea, you may eat larger meals.  Avoid spicy, greasy and heavy food.   ° °General Anesthesia, Adult, Care After  °Refer to this sheet in the next few weeks. These instructions provide you with information on caring for yourself after your procedure. Your health care provider may also give you more specific instructions. Your treatment has been planned according to current medical practices, but problems sometimes occur. Call your health care provider if you have any problems or questions after your procedure.  °WHAT TO EXPECT AFTER THE PROCEDURE  °After the procedure, it is typical to experience:  °Sleepiness.  °Nausea and vomiting. °HOME CARE INSTRUCTIONS  °For the first 24 hours after general anesthesia:  °Have a responsible person with you.  °Do not drive a car. If you are alone, do not take public transportation.  °Do not drink alcohol.  °Do not take medicine that has not been prescribed by your health care provider.  °Do not sign important papers or make important decisions.  °You may resume a normal diet and activities as directed by your health care provider.  °Change bandages (dressings) as directed.  °If you have questions or problems that seem related to general anesthesia, call the hospital and ask for the anesthetist or anesthesiologist on call. °SEEK MEDICAL CARE IF:  °You have nausea and vomiting that continue the day after anesthesia.  °You develop a rash. °SEEK IMMEDIATE MEDICAL CARE IF:  °You have difficulty breathing.  °You have chest pain.  °You have any allergic problems. °Document Released: 08/21/2000 Document Revised: 01/15/2013 Document Reviewed: 11/28/2012  °ExitCare® Patient Information ©2014 [employer].  ° °Sore Throat  ° ° °A sore throat is a painful, burning, sore, or scratchy feeling of the throat. There may be pain or tenderness when swallowing or talking. You may have  other symptoms with a sore throat. These include coughing, sneezing, fever, or a swollen neck. A sore throat is often the first sign of another sickness. These sicknesses may include a cold, flu, strep throat, or an infection called mono. Most sore throats go away without medical treatment.  °HOME CARE  °Only take medicine as told by your doctor.  °Drink enough fluids to keep your pee (urine) clear or pale yellow.  °Rest as needed.  °Try using throat sprays, lozenges, or suck on hard candy (if older than 4 years or as told).  °Sip warm liquids, such as broth, herbal tea, or warm water with honey. Try sucking on frozen ice pops or drinking cold liquids.  °Rinse the mouth (gargle) with salt water. Mix 1 teaspoon salt with 8 ounces of water.  °Do not smoke. Avoid being around others when they are smoking.  °Put a humidifier in your bedroom at night to moisten the air. You can also turn on a hot shower and sit in the bathroom for 5-10 minutes. Be sure the bathroom door is closed. °GET HELP RIGHT AWAY IF:  °You have trouble breathing.  °You cannot swallow fluids, soft foods, or your spit (saliva).  °You have more puffiness (swelling) in the throat.  °Your sore throat does not get better in 7 days.  °You feel sick to your stomach (nauseous) and throw up (vomit).  °You have a fever or lasting symptoms for more than 2-3 days.  °You have a fever and your symptoms suddenly get worse. °MAKE SURE YOU:  °Understand these   instructions.  °Will watch your condition.  °Will get help right away if you are not doing well or get worse. °Document Released: 02/22/2008 Document Revised: 02/07/2012 Document Reviewed: 01/21/2012  °ExitCare® Patient Information ©2015 [employer]. This information is not intended to replace advice given to you by your health care provider. Make sure you discuss any questions you have with your health care provider.  ° ° ° °

## 2014-07-03 NOTE — Transfer of Care (Signed)
Immediate Anesthesia Transfer of Care Note  Patient: Paul Daniels  Procedure(s) Performed: Procedure(s): BILATERAL ENDOSCOPIC SINUS SURGERY NASAL POLYPECTOMY WITH FUSION SCAN  (Bilateral)  Patient Location: PACU  Anesthesia Type:General  Level of Consciousness: awake, alert , oriented and sedated  Airway & Oxygen Therapy: Patient Spontanous Breathing and Patient connected to face mask oxygen  Post-op Assessment: Report given to RN, Post -op Vital signs reviewed and stable and Patient moving all extremities  Post vital signs: Reviewed and stable  Last Vitals:  Filed Vitals:   07/03/14 1043  BP:   Pulse:   Temp:   Resp: 14    Complications: No apparent anesthesia complications

## 2014-07-06 ENCOUNTER — Encounter (HOSPITAL_COMMUNITY): Payer: Self-pay | Admitting: Otolaryngology

## 2014-07-07 LAB — WOUND CULTURE

## 2014-07-08 LAB — ANAEROBIC CULTURE

## 2014-07-30 DIAGNOSIS — R748 Abnormal levels of other serum enzymes: Secondary | ICD-10-CM | POA: Diagnosis not present

## 2014-07-30 DIAGNOSIS — E119 Type 2 diabetes mellitus without complications: Secondary | ICD-10-CM | POA: Diagnosis not present

## 2014-07-30 DIAGNOSIS — I10 Essential (primary) hypertension: Secondary | ICD-10-CM | POA: Diagnosis not present

## 2014-07-30 DIAGNOSIS — E782 Mixed hyperlipidemia: Secondary | ICD-10-CM | POA: Diagnosis not present

## 2014-07-30 DIAGNOSIS — E559 Vitamin D deficiency, unspecified: Secondary | ICD-10-CM | POA: Diagnosis not present

## 2014-07-31 DIAGNOSIS — G4700 Insomnia, unspecified: Secondary | ICD-10-CM | POA: Diagnosis not present

## 2014-07-31 DIAGNOSIS — I10 Essential (primary) hypertension: Secondary | ICD-10-CM | POA: Diagnosis not present

## 2014-07-31 DIAGNOSIS — Z0001 Encounter for general adult medical examination with abnormal findings: Secondary | ICD-10-CM | POA: Diagnosis not present

## 2014-07-31 DIAGNOSIS — E119 Type 2 diabetes mellitus without complications: Secondary | ICD-10-CM | POA: Diagnosis not present

## 2014-07-31 DIAGNOSIS — Z1389 Encounter for screening for other disorder: Secondary | ICD-10-CM | POA: Diagnosis not present

## 2014-07-31 DIAGNOSIS — E782 Mixed hyperlipidemia: Secondary | ICD-10-CM | POA: Diagnosis not present

## 2014-07-31 DIAGNOSIS — E559 Vitamin D deficiency, unspecified: Secondary | ICD-10-CM | POA: Diagnosis not present

## 2014-07-31 DIAGNOSIS — N400 Enlarged prostate without lower urinary tract symptoms: Secondary | ICD-10-CM | POA: Diagnosis not present

## 2014-09-11 DIAGNOSIS — J329 Chronic sinusitis, unspecified: Secondary | ICD-10-CM | POA: Diagnosis not present

## 2014-09-11 DIAGNOSIS — J370 Chronic laryngitis: Secondary | ICD-10-CM | POA: Diagnosis not present

## 2014-09-11 DIAGNOSIS — J309 Allergic rhinitis, unspecified: Secondary | ICD-10-CM | POA: Diagnosis not present

## 2014-10-27 DIAGNOSIS — E119 Type 2 diabetes mellitus without complications: Secondary | ICD-10-CM | POA: Diagnosis not present

## 2014-10-27 DIAGNOSIS — E1165 Type 2 diabetes mellitus with hyperglycemia: Secondary | ICD-10-CM | POA: Diagnosis not present

## 2014-11-10 DIAGNOSIS — J4540 Moderate persistent asthma, uncomplicated: Secondary | ICD-10-CM | POA: Diagnosis not present

## 2014-11-23 ENCOUNTER — Other Ambulatory Visit: Payer: Self-pay

## 2014-12-01 DIAGNOSIS — J4540 Moderate persistent asthma, uncomplicated: Secondary | ICD-10-CM | POA: Diagnosis not present

## 2014-12-01 DIAGNOSIS — J309 Allergic rhinitis, unspecified: Secondary | ICD-10-CM | POA: Diagnosis not present

## 2014-12-28 DIAGNOSIS — L820 Inflamed seborrheic keratosis: Secondary | ICD-10-CM | POA: Diagnosis not present

## 2015-01-26 DIAGNOSIS — M609 Myositis, unspecified: Secondary | ICD-10-CM | POA: Diagnosis not present

## 2015-01-26 DIAGNOSIS — E559 Vitamin D deficiency, unspecified: Secondary | ICD-10-CM | POA: Diagnosis not present

## 2015-01-26 DIAGNOSIS — E782 Mixed hyperlipidemia: Secondary | ICD-10-CM | POA: Diagnosis not present

## 2015-01-26 DIAGNOSIS — R253 Fasciculation: Secondary | ICD-10-CM | POA: Diagnosis not present

## 2015-01-26 DIAGNOSIS — K5190 Ulcerative colitis, unspecified, without complications: Secondary | ICD-10-CM | POA: Diagnosis not present

## 2015-01-26 DIAGNOSIS — R252 Cramp and spasm: Secondary | ICD-10-CM | POA: Diagnosis not present

## 2015-01-26 DIAGNOSIS — R739 Hyperglycemia, unspecified: Secondary | ICD-10-CM | POA: Diagnosis not present

## 2015-01-29 ENCOUNTER — Other Ambulatory Visit: Payer: Self-pay | Admitting: Gastroenterology

## 2015-02-17 DIAGNOSIS — J309 Allergic rhinitis, unspecified: Secondary | ICD-10-CM | POA: Insufficient documentation

## 2015-02-17 DIAGNOSIS — J45909 Unspecified asthma, uncomplicated: Secondary | ICD-10-CM | POA: Insufficient documentation

## 2015-03-02 ENCOUNTER — Ambulatory Visit: Payer: Self-pay | Admitting: Allergy and Immunology

## 2015-03-02 ENCOUNTER — Encounter: Payer: Self-pay | Admitting: Allergy and Immunology

## 2015-03-02 ENCOUNTER — Ambulatory Visit (INDEPENDENT_AMBULATORY_CARE_PROVIDER_SITE_OTHER): Payer: Medicare Other | Admitting: Allergy and Immunology

## 2015-03-02 VITALS — BP 124/70 | HR 58 | Temp 97.7°F | Resp 16 | Ht 67.72 in | Wt 193.6 lb

## 2015-03-02 DIAGNOSIS — K219 Gastro-esophageal reflux disease without esophagitis: Secondary | ICD-10-CM

## 2015-03-02 DIAGNOSIS — J4540 Moderate persistent asthma, uncomplicated: Secondary | ICD-10-CM | POA: Diagnosis not present

## 2015-03-02 DIAGNOSIS — J322 Chronic ethmoidal sinusitis: Secondary | ICD-10-CM

## 2015-03-02 DIAGNOSIS — J3089 Other allergic rhinitis: Secondary | ICD-10-CM

## 2015-03-02 DIAGNOSIS — J387 Other diseases of larynx: Secondary | ICD-10-CM

## 2015-03-02 MED ORDER — FLUTICASONE PROPIONATE HFA 110 MCG/ACT IN AERO: 2.0000 | INHALATION_SPRAY | Freq: Two times a day (BID) | RESPIRATORY_TRACT | Status: DC

## 2015-03-02 NOTE — Patient Instructions (Addendum)
  1. Discontinue Breo and Montelukast; Start Flovent 110 two puffs two times per day with spacer.  2. Continue Proair if needed.  3. Continue Fluticasone nasal spray one spray each nostril one time per day.  4. Get flu vaccine  5. "Action Plan" for asthma flare;   A. Increase flovent to 3 puffs three times per day  B. Use Proair if needed.  6. Return in January 2017 or earlier if problem.

## 2015-03-02 NOTE — Progress Notes (Addendum)
[facility]  Follow-up Note  Subjective  Paul Daniels is a 69 y.o. male who returns to the [facility] in re-evaluation of the following:  HPI Comments: . 1. Moderate persistent asthma, uncomplicated Paul Daniels returns today without any complaints suggesting that his asthma is active. He can exercise and he has no coughing or wheezing and no need to use his bronchodilator. He has not required any sytemic steroids. He is wondering if he can stop his LABA and just use an ICS.   2. Other allergic rhinitis No problems with his upper airways. No congestion, ugly nasal discharge, headaches, or anosmia. Consistently using nasal fluticasone. He is wondering if he can stop his montelukast.  3. Sinusitis chronic, ethmoidal As above, no significant upper airway symptoms. No congestion, ugly nasal discharge, headaches, or anosmia. Consistently using nasal fluticasone.  4. Laryngopharyngeal reflux (LPR) Has had no problems with relux without the use of either ranitidine or omeprazole. No problems with PND, throat clearing , raspy voice or classic reflux symptoms.      Current outpatient prescriptions:  .  albuterol (PROVENTIL HFA;VENTOLIN HFA) 108 (90 BASE) MCG/ACT inhaler, Inhale 2 puffs into the lungs every 4 (four) hours as needed for wheezing or shortness of breath., Disp: , Rfl:  .  ALPRAZolam (XANAX) 1 MG tablet, Take 1-2 mg by mouth at bedtime as needed for anxiety., Disp: , Rfl:  .  amLODipine (NORVASC) 2.5 MG tablet, Take 7.5 mg by mouth daily., Disp: , Rfl:  .  BREO ELLIPTA 200-25 MCG/INH AEPB, Use as directed 1 puff in the mouth or throat daily., Disp: , Rfl:  .  Coenzyme Q10 (CO Q-10) 120 MG CAPS, Take 120 mg by mouth daily., Disp: , Rfl:  .  FISH OIL-KRILL OIL PO, Take 1 capsule by mouth 2 (two) times daily. , Disp: , Rfl:  .  fluticasone (FLONASE) 50 MCG/ACT nasal spray, Place 1 spray into both nostrils daily., Disp:  , Rfl:  .  HYDROcodone-acetaminophen (NORCO) 5-325 MG per tablet, Take 1-2 tablets by mouth every 6 (six) hours as needed., Disp: 30 tablet, Rfl: 0 .  losartan-hydrochlorothiazide (HYZAAR) 100-12.5 MG per tablet, Take 1 tablet by mouth every morning., Disp: , Rfl:  .  metFORMIN (GLUCOPHAGE) 500 MG tablet, Take 500 mg by mouth 2 (two) times daily with a meal., Disp: , Rfl:  .  montelukast (SINGULAIR) 10 MG tablet, Take 10 mg by mouth daily., Disp: , Rfl:  .  potassium chloride SA (KLOR-CON M20) 20 MEQ tablet, Take 20 mEq by mouth every evening. , Disp: , Rfl:  .  moxifloxacin (AVELOX) 400 MG tablet, Take 1 tablet (400 mg total) by mouth daily. (Patient not taking: Reported on 03/02/2015), Disp: 10 tablet, Rfl: 0 .  omeprazole (PRILOSEC) 40 MG capsule, TAKE 1 CAPSULE 30 MINUTES BEFORE DINNER ON AN EMPTY STOMACH. (Patient not taking: Reported on 03/02/2015), Disp: 30 capsule, Rfl: 5 .  ranitidine (ZANTAC) 300 MG capsule, Take 300 mg by mouth every evening., Disp: , Rfl:  .  zolpidem (AMBIEN) 10 MG tablet, Take 10 mg by mouth at bedtime as needed for sleep. , Disp: , Rfl:  .  [DISCONTINUED] doxazosin (CARDURA) 4 MG tablet, Take 4 mg by mouth at bedtime. , Disp: , Rfl:   No orders of the defined types were placed in this encounter.    Allergies  Allergen Reactions  . Acetaminophen     Alcohol consumer-so does not want  to use    Review of Systems  Constitutional: Negative for fever, chills, weight loss and malaise/fatigue.  HENT: Negative for congestion, ear pain, hearing loss, nosebleeds, sore throat and tinnitus.   Eyes: Negative for blurred vision, pain, discharge and redness.  Respiratory: Negative for cough, sputum production, shortness of breath and wheezing.   Cardiovascular: Negative for chest pain, palpitations and leg swelling.  Gastrointestinal: Negative for heartburn, nausea, vomiting and abdominal pain.  Skin: Negative for itching and rash.  Neurological: Negative for dizziness and  headaches.     Objective:   Filed Vitals:   03/02/15 0827  BP: 124/70  Pulse: 58  Temp: 97.7 F (36.5 C)  Resp: 16    Physical Exam  Constitutional: He is well-developed, well-nourished, and in no distress.  HENT:  Head: Normocephalic and atraumatic.  Right Ear: External ear normal.  Left Ear: External ear normal.  Nose: Nose normal.  Mouth/Throat: Oropharynx is clear and moist. No oropharyngeal exudate.  Eyes: Conjunctivae are normal. Pupils are equal, round, and reactive to light. Right eye exhibits no discharge. Left eye exhibits no discharge. No scleral icterus.  Neck: No JVD present. No tracheal deviation present. No thyromegaly present.  Cardiovascular: Normal rate, regular rhythm and normal heart sounds.  Exam reveals no gallop and no friction rub.   No murmur heard. Pulmonary/Chest: Effort normal. No stridor. No respiratory distress. He has no wheezes. He has no rales. He exhibits no tenderness.  Musculoskeletal: He exhibits no edema or tenderness.  Lymphadenopathy:    He has no cervical adenopathy.  Skin: No rash noted. No erythema. No pallor.    Diagnostics:    Spirometry was performed and demonstrated an FEV1 of 2.89 at 94 % of predicted.  The patient had an Asthma Control Test with the following results: ACT Total Score: 24.    Assessment and Plan:   1. Moderate persistent asthma, uncomplicated   2. Other allergic rhinitis   3. Sinusitis chronic, ethmoidal   4. Laryngopharyngeal reflux (LPR)     Patient Instructions   1. Discontinue Breo and Montelukast; Start Flovent 110 two puffs two times per day with spacer.  2. Continue Proair if needed.  3. Continue Fluticasone nasal spray one spray each nostril one time per day.  4. Get flu vaccine  5. "Action Plan" for asthma flare;   A. Increase flovent to 3 puffs three times per day  B. Use Proair if needed.  6. Return in January 2017 or earlier if problem.     Paul Daniels has done well and we will  see if he continues to do well as we taper his medications.    Allena Katz, MD [facility]

## 2015-03-04 DIAGNOSIS — Z0000 Encounter for general adult medical examination without abnormal findings: Secondary | ICD-10-CM | POA: Diagnosis not present

## 2015-03-04 DIAGNOSIS — R253 Fasciculation: Secondary | ICD-10-CM | POA: Diagnosis not present

## 2015-03-04 DIAGNOSIS — L820 Inflamed seborrheic keratosis: Secondary | ICD-10-CM | POA: Diagnosis not present

## 2015-03-04 DIAGNOSIS — R252 Cramp and spasm: Secondary | ICD-10-CM | POA: Diagnosis not present

## 2015-03-04 DIAGNOSIS — Z23 Encounter for immunization: Secondary | ICD-10-CM | POA: Diagnosis not present

## 2015-03-22 ENCOUNTER — Encounter (HOSPITAL_COMMUNITY): Payer: Self-pay | Admitting: *Deleted

## 2015-03-24 DIAGNOSIS — L820 Inflamed seborrheic keratosis: Secondary | ICD-10-CM | POA: Diagnosis not present

## 2015-03-24 DIAGNOSIS — B079 Viral wart, unspecified: Secondary | ICD-10-CM | POA: Diagnosis not present

## 2015-03-29 ENCOUNTER — Encounter (HOSPITAL_COMMUNITY): Payer: Self-pay | Admitting: *Deleted

## 2015-03-29 ENCOUNTER — Ambulatory Visit (HOSPITAL_COMMUNITY)
Admission: RE | Admit: 2015-03-29 | Discharge: 2015-03-29 | Disposition: A | Discharge status: Home / Self Care | Payer: Medicare Other | Source: Ambulatory Visit | Attending: Gastroenterology | Admitting: Gastroenterology

## 2015-03-29 ENCOUNTER — Ambulatory Visit (HOSPITAL_COMMUNITY): Payer: Medicare Other | Admitting: Certified Registered Nurse Anesthetist

## 2015-03-29 ENCOUNTER — Encounter (HOSPITAL_COMMUNITY)
Admission: RE | Disposition: A | Discharge status: Home / Self Care | Payer: Self-pay | Source: Ambulatory Visit | Attending: Gastroenterology

## 2015-03-29 DIAGNOSIS — I10 Essential (primary) hypertension: Secondary | ICD-10-CM | POA: Diagnosis not present

## 2015-03-29 DIAGNOSIS — M1990 Unspecified osteoarthritis, unspecified site: Secondary | ICD-10-CM | POA: Diagnosis not present

## 2015-03-29 DIAGNOSIS — E119 Type 2 diabetes mellitus without complications: Secondary | ICD-10-CM | POA: Insufficient documentation

## 2015-03-29 DIAGNOSIS — J45909 Unspecified asthma, uncomplicated: Secondary | ICD-10-CM | POA: Insufficient documentation

## 2015-03-29 DIAGNOSIS — G4733 Obstructive sleep apnea (adult) (pediatric): Secondary | ICD-10-CM | POA: Insufficient documentation

## 2015-03-29 DIAGNOSIS — Z8719 Personal history of other diseases of the digestive system: Secondary | ICD-10-CM | POA: Diagnosis not present

## 2015-03-29 DIAGNOSIS — K5730 Diverticulosis of large intestine without perforation or abscess without bleeding: Secondary | ICD-10-CM | POA: Insufficient documentation

## 2015-03-29 DIAGNOSIS — Z1211 Encounter for screening for malignant neoplasm of colon: Secondary | ICD-10-CM | POA: Diagnosis not present

## 2015-03-29 DIAGNOSIS — D122 Benign neoplasm of ascending colon: Secondary | ICD-10-CM | POA: Diagnosis not present

## 2015-03-29 DIAGNOSIS — Z79899 Other long term (current) drug therapy: Secondary | ICD-10-CM | POA: Insufficient documentation

## 2015-03-29 DIAGNOSIS — N400 Enlarged prostate without lower urinary tract symptoms: Secondary | ICD-10-CM | POA: Diagnosis not present

## 2015-03-29 DIAGNOSIS — K635 Polyp of colon: Secondary | ICD-10-CM | POA: Diagnosis not present

## 2015-03-29 DIAGNOSIS — Z86010 Personal history of colonic polyps: Secondary | ICD-10-CM | POA: Diagnosis not present

## 2015-03-29 DIAGNOSIS — E669 Obesity, unspecified: Secondary | ICD-10-CM | POA: Diagnosis not present

## 2015-03-29 DIAGNOSIS — E7800 Pure hypercholesterolemia, unspecified: Secondary | ICD-10-CM | POA: Insufficient documentation

## 2015-03-29 DIAGNOSIS — K5790 Diverticulosis of intestine, part unspecified, without perforation or abscess without bleeding: Secondary | ICD-10-CM | POA: Diagnosis not present

## 2015-03-29 HISTORY — PX: COLONOSCOPY WITH PROPOFOL: SHX5780

## 2015-03-29 LAB — GLUCOSE, CAPILLARY: Glucose-Capillary: 130 mg/dL — ABNORMAL HIGH (ref 65–99)

## 2015-03-29 SURGERY — COLONOSCOPY WITH PROPOFOL
Anesthesia: Monitor Anesthesia Care

## 2015-03-29 MED ORDER — ONDANSETRON HCL 4 MG/2ML IJ SOLN
INTRAMUSCULAR | Status: AC
  Filled 2015-03-29: qty 2

## 2015-03-29 MED ORDER — LIDOCAINE HCL (CARDIAC) 20 MG/ML IV SOLN
INTRAVENOUS | Status: AC
  Filled 2015-03-29: qty 5

## 2015-03-29 MED ORDER — LIDOCAINE HCL (CARDIAC) 20 MG/ML IV SOLN
INTRAVENOUS | Status: DC | PRN
  Administered 2015-03-29: 100 mg via INTRAVENOUS

## 2015-03-29 MED ORDER — FENTANYL CITRATE (PF) 100 MCG/2ML IJ SOLN
INTRAMUSCULAR | Status: AC
  Filled 2015-03-29: qty 4

## 2015-03-29 MED ORDER — PROPOFOL 10 MG/ML IV BOLUS
INTRAVENOUS | Status: AC
  Filled 2015-03-29: qty 20

## 2015-03-29 MED ORDER — PROPOFOL 500 MG/50ML IV EMUL
INTRAVENOUS | Status: DC | PRN
  Administered 2015-03-29: 100 ug/kg/min via INTRAVENOUS

## 2015-03-29 MED ORDER — ONDANSETRON HCL 4 MG/2ML IJ SOLN
INTRAMUSCULAR | Status: DC | PRN
Start: 2015-03-29 — End: 2015-03-29
  Administered 2015-03-29: 4 mg via INTRAVENOUS

## 2015-03-29 MED ORDER — PROPOFOL 10 MG/ML IV BOLUS
INTRAVENOUS | Status: DC | PRN
  Administered 2015-03-29: 20 mg via INTRAVENOUS
  Administered 2015-03-29 (×2): 10 mg via INTRAVENOUS

## 2015-03-29 MED ORDER — LACTATED RINGERS IV SOLN
INTRAVENOUS | Status: DC
Start: 2015-03-29 — End: 2015-03-29
  Administered 2015-03-29: 09:00:00 via INTRAVENOUS
  Administered 2015-03-29: 1000 mL via INTRAVENOUS

## 2015-03-29 MED ORDER — FENTANYL CITRATE (PF) 100 MCG/2ML IJ SOLN
INTRAMUSCULAR | Status: DC | PRN
  Administered 2015-03-29 (×2): 25 ug via INTRAVENOUS

## 2015-03-29 MED ORDER — SODIUM CHLORIDE 0.9 % IV SOLN: INTRAVENOUS | Status: DC

## 2015-03-29 SURGICAL SUPPLY — 22 items

## 2015-03-29 NOTE — Anesthesia Postprocedure Evaluation (Signed)
  Anesthesia Post-op Note  Patient: Paul Daniels  Procedure(s) Performed: Procedure(s) (LRB): COLONOSCOPY WITH PROPOFOL (N/A)  Patient Location: PACU  Anesthesia Type: MAC  Level of Consciousness: awake and alert   Airway and Oxygen Therapy: Patient Spontanous Breathing  Post-op Pain: mild  Post-op Assessment: Post-op Vital signs reviewed, Patient's Cardiovascular Status Stable, Respiratory Function Stable, Patent Airway and No signs of Nausea or vomiting  Last Vitals:  Filed Vitals:   03/29/15 0935  BP:   Pulse: 57  Temp:   Resp: 21    Post-op Vital Signs: stable   Complications: No apparent anesthesia complications

## 2015-03-29 NOTE — Discharge Instructions (Signed)

## 2015-03-29 NOTE — Anesthesia Preprocedure Evaluation (Signed)
Anesthesia Evaluation  Patient identified by MRN, date of birth, ID band Patient awake    Reviewed: Allergy & Precautions, H&P , NPO status , Patient's Chart, lab work & pertinent test results  Airway Mallampati: II  TM Distance: >3 FB Neck ROM: Full    Dental no notable dental hx. (+) Dental Advisory Given, Teeth Intact   Pulmonary asthma , sleep apnea ,    Pulmonary exam normal breath sounds clear to auscultation       Cardiovascular Exercise Tolerance: Good hypertension, Pt. on medications negative cardio ROS Normal cardiovascular exam Rhythm:Regular Rate:Normal     Neuro/Psych Anxiety Depression negative neurological ROS  negative psych ROS   GI/Hepatic negative GI ROS, Neg liver ROS, PUD, GERD  Medicated and Controlled,  Endo/Other  diabetes, Well Controlled, Type 2  Renal/GU negative Renal ROS  negative genitourinary   Musculoskeletal   Abdominal (+)  Abdomen: soft.    Peds  Hematology negative hematology ROS (+)   Anesthesia Other Findings   Reproductive/Obstetrics negative OB ROS                             Anesthesia Physical Anesthesia Plan  ASA: III  Anesthesia Plan: MAC   Post-op Pain Management:    Induction:   Airway Management Planned:   Additional Equipment:   Intra-op Plan:   Post-operative Plan:   Informed Consent: I have reviewed the patients History and Physical, chart, labs and discussed the procedure including the risks, benefits and alternatives for the proposed anesthesia with the patient or authorized representative who has indicated his/her understanding and acceptance.   Dental Advisory Given  Plan Discussed with: CRNA and Surgeon  Anesthesia Plan Comments:         Anesthesia Quick Evaluation

## 2015-03-29 NOTE — H&P (Signed)
  Procedure: Surveillance colonoscopy. Chronic universal ulcerative proctocolitis.  History: The patient is a 69 year old male born 02-13-1946. He is scheduled to undergo a surveillance colonoscopy today.  Past medical history: Type 2 diabetes mellitus. Hypertension. Anxiety with depression. Hypercholesterolemia. Benign prostatic hypertrophy. TURP. Obstructive sleep apnea syndrome. Osteoarthritis. 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 Cholecystectomy. Left inguinal herniorrhaphy.  Exam: The patient is alert and lying comfortably on the endoscopy stretcher. Abdomen is soft and nontender to palpation. Cardiac exam reveals a regular rhythm. Lungs are clear to auscultation.  Plan: Proceed with surveillance colonoscopy

## 2015-03-29 NOTE — Op Note (Signed)
Procedure: Surveillance colonoscopy. Chronic universal ulcerative proctocolitis in remission  Endoscopist: Earle Gell  Premedication: Propofol administered by anesthesia  Procedure: The patient was placed in the left lateral decubitus position. Anal inspection and digital rectal exam were normal. The Pentax pediatric colonoscope was introduced into the rectum and advanced to the cecum. A normal-appearing ileocecal valve and appendiceal orifice were identified. Colonic preparation for the exam today was good. Withdrawal time was 17 minutes  Rectum. Normal. Retroflex view of the distal rectum was normal  Sigmoid colon and descending colon. Normal  Splenic flexure. Normal  Transverse colon. Normal  Hepatic flexure. Normal  Ascending colon. Normal  Cecum and ileocecal valve. Normal  Surveillance mucosal biopsies. Four biopsies were performed approximately every 10 cm for a total of 32 random mucosal biopsies performed to look for mucosal dysplasia.  Assessment:  #1. Universal colonic diverticulosis  #2. In active universal ulcerative proctocolitis  #3. Surveillance mucosal biopsies to rule out mucosal dysplasia pending

## 2015-03-29 NOTE — Transfer of Care (Signed)
Immediate Anesthesia Transfer of Care Note  Patient: Paul Daniels  Procedure(s) Performed: Procedure(s): COLONOSCOPY WITH PROPOFOL (N/A)  Patient Location: ENDO  Anesthesia Type:MAC  Level of Consciousness:  sedated, patient cooperative and responds to stimulation  Airway & Oxygen Therapy:Patient Spontanous Breathing and Patient connected to face mask oxgen  Post-op Assessment:  Report given to ENDO RN and Post -op Vital signs reviewed and stable  Post vital signs:  Reviewed and stable  Last Vitals:  Filed Vitals:   03/29/15 0725  BP: 154/92  Pulse: 58  Temp: 36.6 C  Resp: 14    Complications: No apparent anesthesia complications

## 2015-03-30 ENCOUNTER — Encounter (HOSPITAL_COMMUNITY): Payer: Self-pay | Admitting: Gastroenterology

## 2015-03-31 DIAGNOSIS — N401 Enlarged prostate with lower urinary tract symptoms: Secondary | ICD-10-CM | POA: Diagnosis not present

## 2015-03-31 DIAGNOSIS — N5201 Erectile dysfunction due to arterial insufficiency: Secondary | ICD-10-CM | POA: Diagnosis not present

## 2015-03-31 DIAGNOSIS — R3916 Straining to void: Secondary | ICD-10-CM | POA: Diagnosis not present

## 2015-04-09 DIAGNOSIS — H2513 Age-related nuclear cataract, bilateral: Secondary | ICD-10-CM | POA: Diagnosis not present

## 2015-04-09 DIAGNOSIS — E119 Type 2 diabetes mellitus without complications: Secondary | ICD-10-CM | POA: Diagnosis not present

## 2015-05-03 DIAGNOSIS — F4323 Adjustment disorder with mixed anxiety and depressed mood: Secondary | ICD-10-CM | POA: Diagnosis not present

## 2015-05-03 DIAGNOSIS — F329 Major depressive disorder, single episode, unspecified: Secondary | ICD-10-CM | POA: Diagnosis not present

## 2015-05-14 DIAGNOSIS — R3916 Straining to void: Secondary | ICD-10-CM | POA: Diagnosis not present

## 2015-05-14 DIAGNOSIS — N390 Urinary tract infection, site not specified: Secondary | ICD-10-CM | POA: Diagnosis not present

## 2015-05-14 DIAGNOSIS — N401 Enlarged prostate with lower urinary tract symptoms: Secondary | ICD-10-CM | POA: Diagnosis not present

## 2015-06-12 ENCOUNTER — Encounter: Payer: Self-pay | Admitting: Allergy and Immunology

## 2015-06-15 IMAGING — CR DG CHEST 2V
3 series · 3 of 3 positions shown · non-contrast
Comparison: 06/28/2011

CLINICAL DATA: Chronic cough.  Fever and flu-like symptoms.

EXAM:
CHEST  2 VIEW

[view not recorded (1 of 3)]
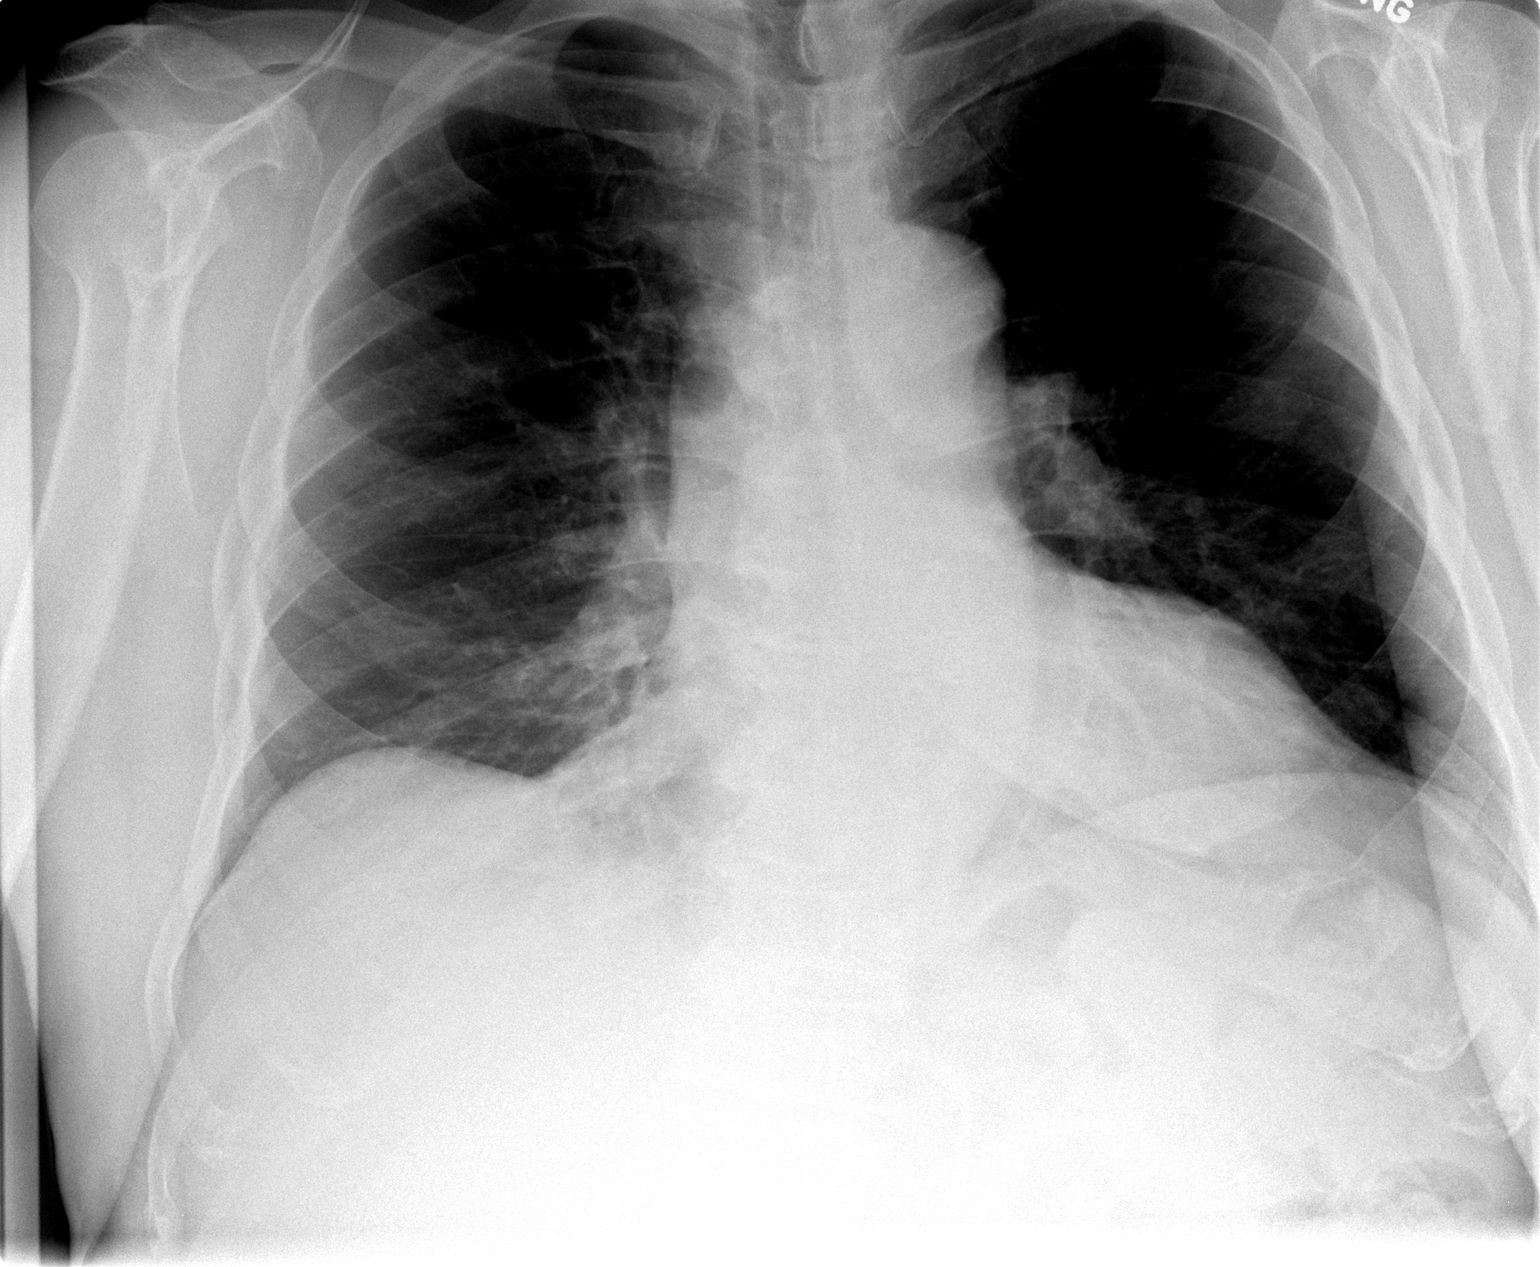

[view not recorded (2 of 3)]
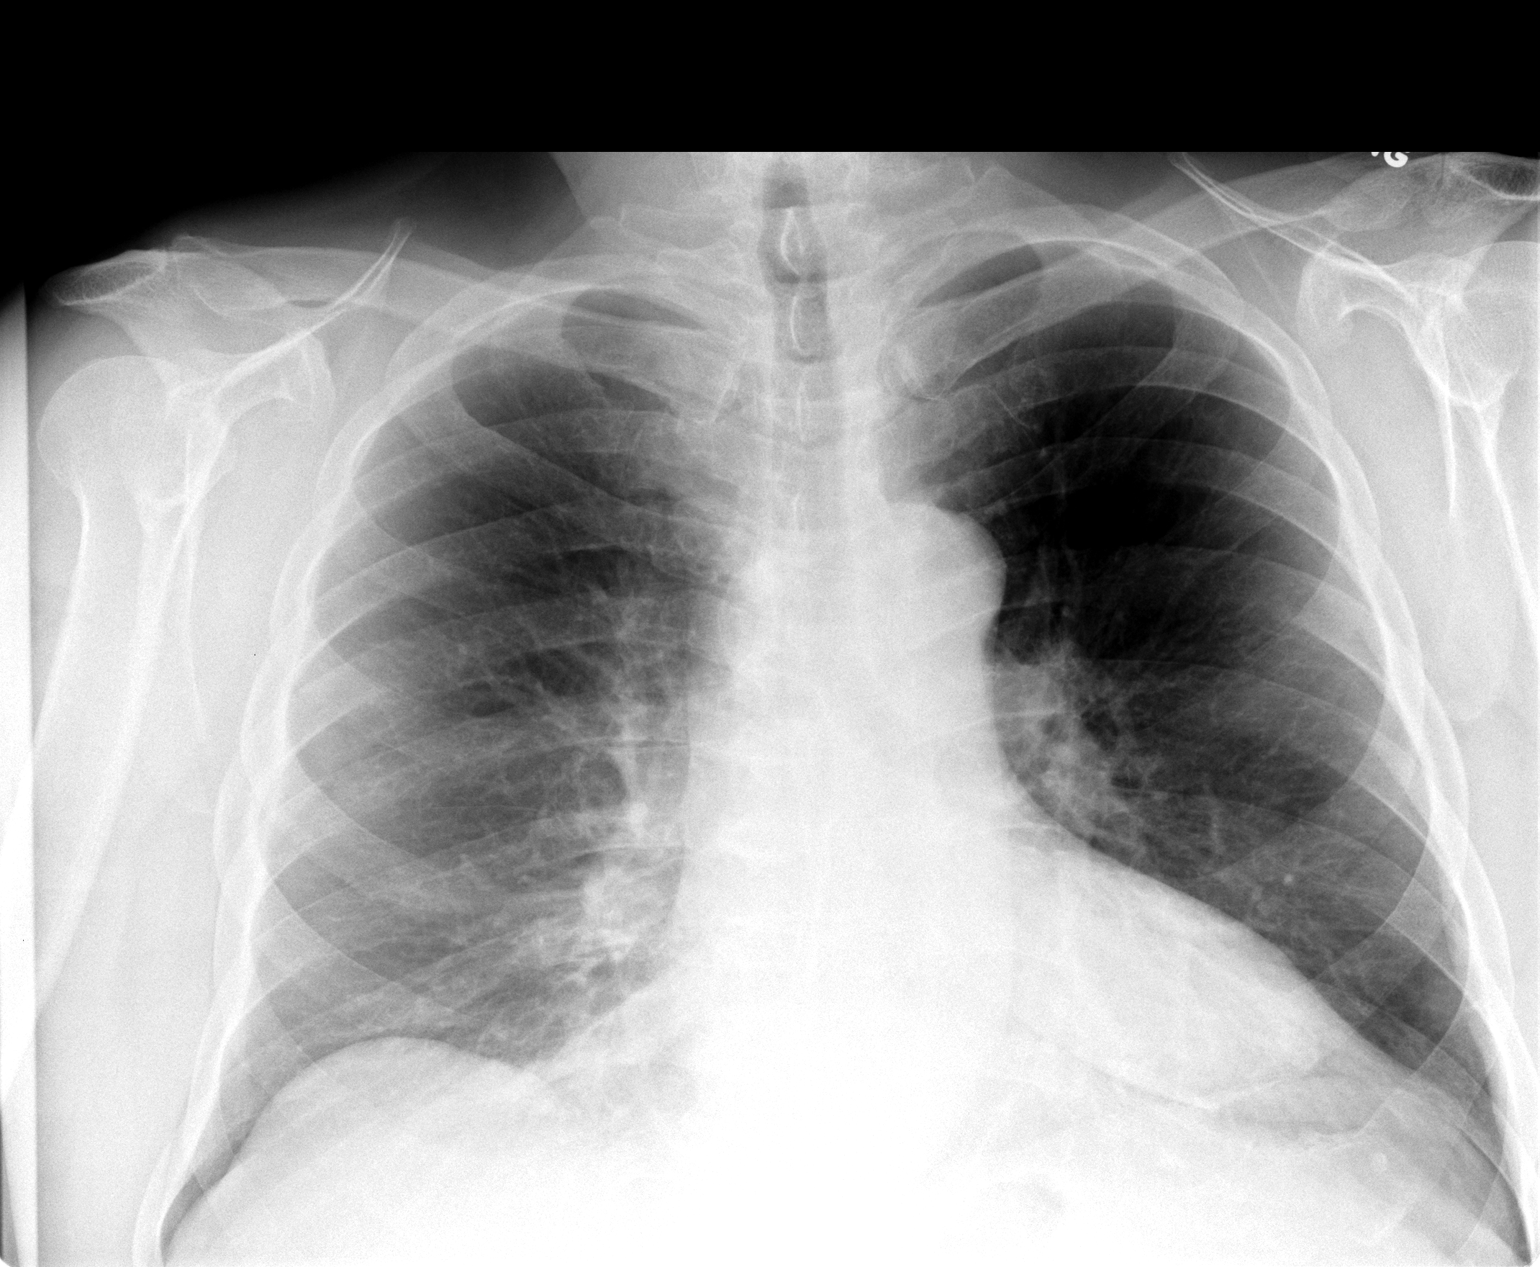

[view not recorded (3 of 3)]
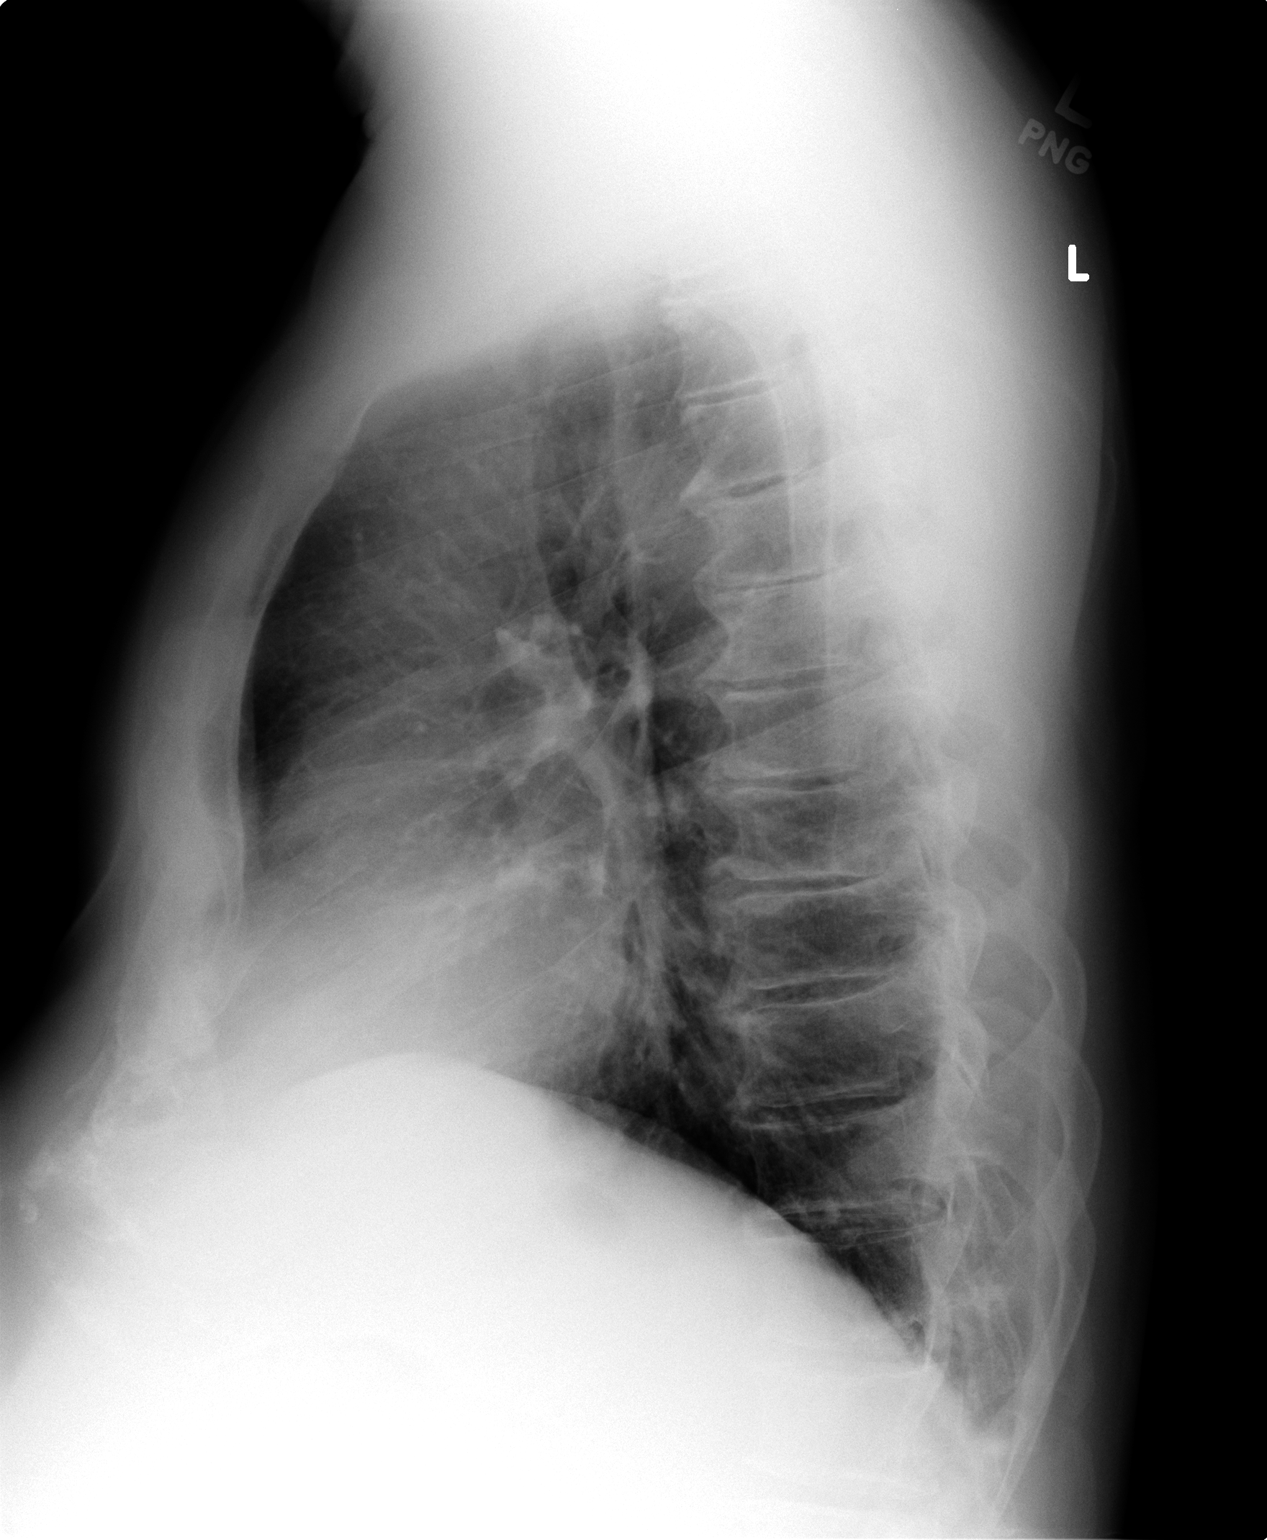

[3 of 3 positions shown; findings below may reference images not displayed]

FINDINGS: Borderline cardiomegaly. Heart size and morphology is stable from
7061. Mild aortic tortuosity; negative hila. Unchanged convexity of
the lower right mediastinum, usually from increase in mediastinal
fat. There is no edema, consolidation, effusion, or pneumothorax.
IMPRESSION: Negative for pneumonia.

## 2015-06-29 ENCOUNTER — Encounter: Payer: Self-pay | Admitting: Allergy and Immunology

## 2015-06-29 ENCOUNTER — Ambulatory Visit (INDEPENDENT_AMBULATORY_CARE_PROVIDER_SITE_OTHER): Payer: Medicare Other | Admitting: Allergy and Immunology

## 2015-06-29 VITALS — BP 128/82 | HR 60 | Resp 18

## 2015-06-29 DIAGNOSIS — J387 Other diseases of larynx: Secondary | ICD-10-CM | POA: Diagnosis not present

## 2015-06-29 DIAGNOSIS — J322 Chronic ethmoidal sinusitis: Secondary | ICD-10-CM

## 2015-06-29 DIAGNOSIS — K219 Gastro-esophageal reflux disease without esophagitis: Secondary | ICD-10-CM

## 2015-06-29 DIAGNOSIS — J4540 Moderate persistent asthma, uncomplicated: Secondary | ICD-10-CM | POA: Diagnosis not present

## 2015-06-29 DIAGNOSIS — J3089 Other allergic rhinitis: Secondary | ICD-10-CM

## 2015-06-29 NOTE — Patient Instructions (Addendum)
  1. Dulera 200 - 2 inhalations twice a day with spacer. Sample. Replaces Flovent ([employer]?)  2. Consider a course of immunotherapy - literature given  3. Change Flonase to over-the-counter Rhinocort one spray each nostril one time per day  4. Can use ranitidine 300 mg one time or 2 times per day  5. Can use antihistamine, nasal saline, pro-air Respiclick if needed  6. Return to clinic in Summer 2017 or earlier if problem

## 2015-06-29 NOTE — Progress Notes (Signed)
[facility]  Follow-up Note  Referring Provider: Josetta Huddle, MD Primary Provider: Henrine Screws, MD Date of Office Visit: 06/29/2015  Subjective:   Paul Daniels is a 70 y.o. male who returns to the [facility] on 06/29/2015 in re-evaluation of the following:  HPI Comments: Paul Daniels returns to this clinic in evaluation of his asthma, allergic rhinitis, chronic sinusitis treated with fess, and LPR. He thinks that his asthma is doing relatively well in that he does not need to use a bronchodilator and he can exercise without much difficulty while continuing to use his Flovent 110 on a consistent basis. However, he still has a little bit of cough and little bit of phlegm in his airway. Some of the phlegm is in his throat. He's been using his ranitidine 300 mg once a day at nighttime and he does not have any classic reflux symptoms. His nose is doing well but he never did have return of his ability to smell over the course of the past year or so ever since he had surgery. It actually sounds as though he lost his sense of smell several weeks after his surgery was completed. Interestingly, that may correlate with restarting nasal fluticasone at that point.   Current Outpatient Prescriptions on File Prior to Visit  Medication Sig Dispense Refill  . albuterol (PROVENTIL HFA;VENTOLIN HFA) 108 (90 BASE) MCG/ACT inhaler Inhale 2 puffs into the lungs every 4 (four) hours as needed for wheezing or shortness of breath.    Marland Kitchen amLODipine (NORVASC) 2.5 MG tablet Take 7.5 mg by mouth daily.    Marland Kitchen aspirin 81 MG tablet Take 162 mg by mouth daily.    . Coenzyme Q10 (CO Q-10) 120 MG CAPS Take 120 mg by mouth daily.    Marland Kitchen FISH OIL-KRILL OIL PO Take 1 capsule by mouth 2 (two) times daily.     . fluticasone (FLONASE) 50 MCG/ACT nasal spray Place 1 spray into both nostrils daily.    . fluticasone (FLOVENT HFA) 110 MCG/ACT inhaler Inhale 2 puffs  into the lungs 2 (two) times daily. 1 Inhaler 3  . losartan-hydrochlorothiazide (HYZAAR) 100-12.5 MG per tablet Take 1 tablet by mouth every morning.    . metFORMIN (GLUCOPHAGE) 500 MG tablet Take 500 mg by mouth 2 (two) times daily with a meal.    . potassium chloride SA (KLOR-CON M20) 20 MEQ tablet Take 20 mEq by mouth every evening.     Marland Kitchen ibuprofen (ADVIL,MOTRIN) 200 MG tablet Take 600 mg by mouth every 6 (six) hours as needed for moderate pain. Reported on 06/29/2015    . [DISCONTINUED] doxazosin (CARDURA) 4 MG tablet Take 4 mg by mouth at bedtime.      No current facility-administered medications on file prior to visit.    No orders of the defined types were placed in this encounter.    Past Medical History  Diagnosis Date  . Ulcerative colitis     no recent flares  . Insomnia   . Obesity   . BPH (benign prostatic hyperplasia)   . Anxiety   . Hypokalemia   . Hypertension   . Hyperlipidemia   . Diabetes mellitus, type 2 ([place])   . OSA (obstructive sleep apnea) yrs ago    borderline, did not like cpap  . DJD (degenerative joint disease)     bilateral knee's  . Depression   . GERD (gastroesophageal reflux disease)     "silent reflux"  Past Surgical History  Procedure Laterality Date  . Cystoscopy  08/10/2011    Procedure: CYSTOSCOPY;  Surgeon: Dutch Gray, MD;  Location: [facility];  Service: Urology;  Laterality: N/A;  . Transurethral resection of prostate  08/10/2011    Procedure: TRANSURETHRAL RESECTION OF THE PROSTATE (TURP);  Surgeon: Dutch Gray, MD;  Location: [facility];  Service: Urology;  Laterality: N/A;  . Inguinal hernia repair  1994  . Cholecystectomy  1998  . Colonoscopy with propofol N/A 03/18/2013    Procedure: COLONOSCOPY WITH PROPOFOL;  Surgeon: Garlan Fair, MD;  Location: [facility];  Service: Endoscopy;  Laterality: N/A;  . Sinus endo w/fusion Bilateral 07/03/2014    Procedure: BILATERAL ENDOSCOPIC SINUS SURGERY NASAL POLYPECTOMY WITH FUSION SCAN ;   Surgeon: Jerrell Belfast, MD;  Location: [facility];  Service: ENT;  Laterality: Bilateral;  . Colonoscopy with propofol N/A 03/29/2015    Procedure: COLONOSCOPY WITH PROPOFOL;  Surgeon: Garlan Fair, MD;  Location: [facility];  Service: Endoscopy;  Laterality: N/A;    No Known Allergies  Review of systems negative except as noted in HPI / PMHx or noted below:  Review of Systems  Constitutional: Negative.   HENT: Negative.   Eyes: Negative.   Respiratory: Negative.   Cardiovascular: Negative.   Gastrointestinal: Negative.   Genitourinary: Negative.   Musculoskeletal: Negative.   Skin: Negative.   Neurological: Negative.   Endo/Heme/Allergies: Negative.   Psychiatric/Behavioral: Negative.      Objective:   Filed Vitals:   06/29/15 1052  BP: 128/82  Pulse: 60  Resp: 18          Physical Exam  Constitutional: He is well-developed, well-nourished, and in no distress.  HENT:  Head: Normocephalic.  Right Ear: Tympanic membrane, external ear and ear canal normal.  Left Ear: Tympanic membrane, external ear and ear canal normal.  Nose: Nose normal. No mucosal edema or rhinorrhea.  Mouth/Throat: Uvula is midline, oropharynx is clear and moist and mucous membranes are normal. No oropharyngeal exudate.  Eyes: Conjunctivae are normal.  Neck: Trachea normal. No tracheal tenderness present. No tracheal deviation present. No thyromegaly present.  Cardiovascular: Normal rate, regular rhythm, S1 normal, S2 normal and normal heart sounds.   No murmur heard. Pulmonary/Chest: No stridor. No respiratory distress. He has wheezes (Right posterior and expiratory wheezing). He has no rales.  Musculoskeletal: He exhibits no edema.  Lymphadenopathy:       Head (right side): No tonsillar adenopathy present.       Head (left side): No tonsillar adenopathy present.    He has no cervical adenopathy.    He has no axillary adenopathy.  Neurological: He is alert. Gait normal.  Skin: No rash  noted. He is not diaphoretic. No erythema. Nails show no clubbing.  Psychiatric: Mood and affect normal.    Diagnostics:    Spirometry was performed and demonstrated an FEV1 of 2.33 at 75 % of predicted.  The patient had an Asthma Control Test with the following results: ACT Total Score: 23.    Assessment and Plan:   1. Moderate persistent asthma, uncomplicated   2. Other allergic rhinitis   3. Sinusitis chronic, ethmoidal   4. Laryngopharyngeal reflux (LPR)     Patient Instructions   1. Dulera 200 - 2 inhalations twice a day with spacer. Sample. Replaces Flovent ([employer]?)  2. Consider a course of immunotherapy - literature given  3. Change Flonase to over-the-counter Rhinocort one spray each nostril one time per day  4. Can use  ranitidine 300 mg one time or 2 times per day  5. Can use antihistamine, nasal saline, pro-air Respiclick if needed  6. Return to clinic in Summer 2017 or earlier if problem   I will have Krikor step up his therapy for inflammation of his airway with the administration of Dulera to replaces Flovent. In addition, given the slim possibility that Flonase may be contributing to some of his anosmia I will have him use over-the-counter Rhinocort as a substitute. He can continue to use ranitidine one or 2 times per day for his reflux at this point. I've also given him some literature about immunotherapy as he is presently considering this option given the fact that he is very atopic and has exposure to allergens on a daily basis. I'll see him back in this clinic in the summer of 2017 or earlier if there is a problem.  Allena Katz, MD [facility]

## 2015-06-30 DIAGNOSIS — L820 Inflamed seborrheic keratosis: Secondary | ICD-10-CM | POA: Diagnosis not present

## 2015-06-30 DIAGNOSIS — L570 Actinic keratosis: Secondary | ICD-10-CM | POA: Diagnosis not present

## 2015-06-30 DIAGNOSIS — X32XXXD Exposure to sunlight, subsequent encounter: Secondary | ICD-10-CM | POA: Diagnosis not present

## 2015-07-26 DIAGNOSIS — J3489 Other specified disorders of nose and nasal sinuses: Secondary | ICD-10-CM | POA: Diagnosis not present

## 2015-07-26 DIAGNOSIS — Z7984 Long term (current) use of oral hypoglycemic drugs: Secondary | ICD-10-CM | POA: Diagnosis not present

## 2015-07-26 DIAGNOSIS — R062 Wheezing: Secondary | ICD-10-CM | POA: Diagnosis not present

## 2015-07-26 DIAGNOSIS — R1314 Dysphagia, pharyngoesophageal phase: Secondary | ICD-10-CM | POA: Diagnosis not present

## 2015-07-26 DIAGNOSIS — R05 Cough: Secondary | ICD-10-CM | POA: Diagnosis not present

## 2015-07-26 DIAGNOSIS — J383 Other diseases of vocal cords: Secondary | ICD-10-CM | POA: Diagnosis not present

## 2015-07-26 DIAGNOSIS — R490 Dysphonia: Secondary | ICD-10-CM | POA: Diagnosis not present

## 2015-07-26 DIAGNOSIS — K219 Gastro-esophageal reflux disease without esophagitis: Secondary | ICD-10-CM | POA: Diagnosis not present

## 2015-07-26 DIAGNOSIS — E119 Type 2 diabetes mellitus without complications: Secondary | ICD-10-CM | POA: Diagnosis not present

## 2015-07-26 DIAGNOSIS — Z79899 Other long term (current) drug therapy: Secondary | ICD-10-CM | POA: Diagnosis not present

## 2015-07-26 DIAGNOSIS — R0683 Snoring: Secondary | ICD-10-CM | POA: Diagnosis not present

## 2015-08-04 DIAGNOSIS — R1314 Dysphagia, pharyngoesophageal phase: Secondary | ICD-10-CM | POA: Diagnosis not present

## 2015-08-04 DIAGNOSIS — K449 Diaphragmatic hernia without obstruction or gangrene: Secondary | ICD-10-CM | POA: Diagnosis not present

## 2015-08-04 DIAGNOSIS — K224 Dyskinesia of esophagus: Secondary | ICD-10-CM | POA: Diagnosis not present

## 2015-08-04 DIAGNOSIS — K222 Esophageal obstruction: Secondary | ICD-10-CM | POA: Diagnosis not present

## 2015-08-04 DIAGNOSIS — R1310 Dysphagia, unspecified: Secondary | ICD-10-CM | POA: Diagnosis not present

## 2015-08-04 DIAGNOSIS — R490 Dysphonia: Secondary | ICD-10-CM | POA: Diagnosis not present

## 2015-08-04 DIAGNOSIS — J383 Other diseases of vocal cords: Secondary | ICD-10-CM | POA: Diagnosis not present

## 2015-08-11 DIAGNOSIS — J324 Chronic pansinusitis: Secondary | ICD-10-CM | POA: Diagnosis not present

## 2015-08-11 DIAGNOSIS — R05 Cough: Secondary | ICD-10-CM | POA: Diagnosis not present

## 2015-08-11 DIAGNOSIS — R1312 Dysphagia, oropharyngeal phase: Secondary | ICD-10-CM | POA: Diagnosis not present

## 2015-08-11 DIAGNOSIS — D721 Eosinophilia: Secondary | ICD-10-CM | POA: Diagnosis not present

## 2015-08-11 DIAGNOSIS — E119 Type 2 diabetes mellitus without complications: Secondary | ICD-10-CM | POA: Diagnosis not present

## 2015-08-11 DIAGNOSIS — K219 Gastro-esophageal reflux disease without esophagitis: Secondary | ICD-10-CM | POA: Diagnosis not present

## 2015-08-11 DIAGNOSIS — Z87891 Personal history of nicotine dependence: Secondary | ICD-10-CM | POA: Diagnosis not present

## 2015-08-11 DIAGNOSIS — J339 Nasal polyp, unspecified: Secondary | ICD-10-CM | POA: Diagnosis not present

## 2015-08-11 DIAGNOSIS — R062 Wheezing: Secondary | ICD-10-CM | POA: Diagnosis not present

## 2015-08-23 DIAGNOSIS — Z09 Encounter for follow-up examination after completed treatment for conditions other than malignant neoplasm: Secondary | ICD-10-CM | POA: Diagnosis not present

## 2015-08-23 DIAGNOSIS — J383 Other diseases of vocal cords: Secondary | ICD-10-CM | POA: Diagnosis not present

## 2015-08-23 DIAGNOSIS — R490 Dysphonia: Secondary | ICD-10-CM | POA: Diagnosis not present

## 2015-08-23 DIAGNOSIS — R1314 Dysphagia, pharyngoesophageal phase: Secondary | ICD-10-CM | POA: Diagnosis not present

## 2015-08-25 DIAGNOSIS — E119 Type 2 diabetes mellitus without complications: Secondary | ICD-10-CM | POA: Diagnosis not present

## 2015-08-25 DIAGNOSIS — Z125 Encounter for screening for malignant neoplasm of prostate: Secondary | ICD-10-CM | POA: Diagnosis not present

## 2015-08-25 DIAGNOSIS — R739 Hyperglycemia, unspecified: Secondary | ICD-10-CM | POA: Diagnosis not present

## 2015-08-25 DIAGNOSIS — E782 Mixed hyperlipidemia: Secondary | ICD-10-CM | POA: Diagnosis not present

## 2015-08-25 DIAGNOSIS — E559 Vitamin D deficiency, unspecified: Secondary | ICD-10-CM | POA: Diagnosis not present

## 2015-08-25 DIAGNOSIS — F329 Major depressive disorder, single episode, unspecified: Secondary | ICD-10-CM | POA: Diagnosis not present

## 2015-08-25 DIAGNOSIS — Z0000 Encounter for general adult medical examination without abnormal findings: Secondary | ICD-10-CM | POA: Diagnosis not present

## 2015-08-25 DIAGNOSIS — R252 Cramp and spasm: Secondary | ICD-10-CM | POA: Diagnosis not present

## 2015-08-25 DIAGNOSIS — K5190 Ulcerative colitis, unspecified, without complications: Secondary | ICD-10-CM | POA: Diagnosis not present

## 2015-08-25 DIAGNOSIS — M609 Myositis, unspecified: Secondary | ICD-10-CM | POA: Diagnosis not present

## 2015-08-25 DIAGNOSIS — Z1389 Encounter for screening for other disorder: Secondary | ICD-10-CM | POA: Diagnosis not present

## 2015-08-25 DIAGNOSIS — I10 Essential (primary) hypertension: Secondary | ICD-10-CM | POA: Diagnosis not present

## 2015-09-01 DIAGNOSIS — R05 Cough: Secondary | ICD-10-CM | POA: Diagnosis not present

## 2015-09-01 DIAGNOSIS — D721 Eosinophilia: Secondary | ICD-10-CM | POA: Diagnosis not present

## 2015-09-01 DIAGNOSIS — J339 Nasal polyp, unspecified: Secondary | ICD-10-CM | POA: Diagnosis not present

## 2015-09-01 DIAGNOSIS — J324 Chronic pansinusitis: Secondary | ICD-10-CM | POA: Diagnosis not present

## 2015-09-01 DIAGNOSIS — Z87891 Personal history of nicotine dependence: Secondary | ICD-10-CM | POA: Diagnosis not present

## 2015-09-01 DIAGNOSIS — R062 Wheezing: Secondary | ICD-10-CM | POA: Diagnosis not present

## 2015-11-03 DIAGNOSIS — E119 Type 2 diabetes mellitus without complications: Secondary | ICD-10-CM | POA: Diagnosis not present

## 2015-11-03 DIAGNOSIS — J339 Nasal polyp, unspecified: Secondary | ICD-10-CM | POA: Diagnosis not present

## 2015-11-03 DIAGNOSIS — Z7984 Long term (current) use of oral hypoglycemic drugs: Secondary | ICD-10-CM | POA: Diagnosis not present

## 2015-11-03 DIAGNOSIS — R05 Cough: Secondary | ICD-10-CM | POA: Diagnosis not present

## 2015-11-03 DIAGNOSIS — J324 Chronic pansinusitis: Secondary | ICD-10-CM | POA: Diagnosis not present

## 2015-11-03 DIAGNOSIS — Z9889 Other specified postprocedural states: Secondary | ICD-10-CM | POA: Diagnosis not present

## 2015-11-03 DIAGNOSIS — D721 Eosinophilia: Secondary | ICD-10-CM | POA: Diagnosis not present

## 2015-11-03 DIAGNOSIS — Z7951 Long term (current) use of inhaled steroids: Secondary | ICD-10-CM | POA: Diagnosis not present

## 2015-11-03 DIAGNOSIS — Z87891 Personal history of nicotine dependence: Secondary | ICD-10-CM | POA: Diagnosis not present

## 2015-11-03 DIAGNOSIS — J45909 Unspecified asthma, uncomplicated: Secondary | ICD-10-CM | POA: Diagnosis not present

## 2015-11-03 DIAGNOSIS — I10 Essential (primary) hypertension: Secondary | ICD-10-CM | POA: Diagnosis not present

## 2015-11-11 DIAGNOSIS — Z7984 Long term (current) use of oral hypoglycemic drugs: Secondary | ICD-10-CM | POA: Diagnosis not present

## 2015-11-11 DIAGNOSIS — E119 Type 2 diabetes mellitus without complications: Secondary | ICD-10-CM | POA: Diagnosis not present

## 2015-11-17 DIAGNOSIS — N401 Enlarged prostate with lower urinary tract symptoms: Secondary | ICD-10-CM | POA: Diagnosis not present

## 2015-11-17 DIAGNOSIS — R338 Other retention of urine: Secondary | ICD-10-CM | POA: Diagnosis not present

## 2016-01-03 DIAGNOSIS — L820 Inflamed seborrheic keratosis: Secondary | ICD-10-CM | POA: Diagnosis not present

## 2016-01-03 DIAGNOSIS — L821 Other seborrheic keratosis: Secondary | ICD-10-CM | POA: Diagnosis not present

## 2016-01-19 ENCOUNTER — Telehealth: Payer: Self-pay | Admitting: *Deleted

## 2016-01-19 NOTE — Telephone Encounter (Signed)
Pt asked for an appt for treatment of fungal toenails. Transferred to schedulers.

## 2016-01-25 ENCOUNTER — Encounter: Payer: Self-pay | Admitting: Allergy and Immunology

## 2016-01-25 ENCOUNTER — Ambulatory Visit (INDEPENDENT_AMBULATORY_CARE_PROVIDER_SITE_OTHER): Payer: Medicare Other | Admitting: Allergy and Immunology

## 2016-01-25 VITALS — BP 118/84 | HR 64 | Resp 16

## 2016-01-25 DIAGNOSIS — J4540 Moderate persistent asthma, uncomplicated: Secondary | ICD-10-CM | POA: Diagnosis not present

## 2016-01-25 DIAGNOSIS — J3089 Other allergic rhinitis: Secondary | ICD-10-CM

## 2016-01-25 DIAGNOSIS — J387 Other diseases of larynx: Secondary | ICD-10-CM

## 2016-01-25 DIAGNOSIS — J322 Chronic ethmoidal sinusitis: Secondary | ICD-10-CM | POA: Diagnosis not present

## 2016-01-25 DIAGNOSIS — K219 Gastro-esophageal reflux disease without esophagitis: Secondary | ICD-10-CM

## 2016-01-25 MED ORDER — MONTELUKAST SODIUM 10 MG PO TABS: ORAL_TABLET | ORAL | 1 refills | Status: DC

## 2016-01-25 NOTE — Progress Notes (Signed)
Consider stopping fish oil Follow-up Note  Referring Provider: Josetta Huddle, MD Primary Provider: Henrine Screws, MD Date of Office Visit: 01/25/2016  Subjective:   Paul Daniels (DOB: April 02, 1946) is a 70 y.o. male who returns to the [facility] on 01/25/2016 in re-evaluation of the following:  HPI: Adma presents this clinic in reevaluation of his asthma and allergic rhinitis and chronic sinusitis and history of LPR. I've not seen him in his clinic since January 2017.  During the interval he has not required a systemic steroid to treat an episode of asthma exacerbation. He does get short of breath and wheezing if he exerts himself to any large degree for which he will use a short acting bronchodilator less than 1 time per week. He does not have any nocturnal bronchospastic symptoms. He's now on Breo 200 to replace his Ruthe Mannan for an insurance issue.  He is under the care of Dr. Vicente Masson at [facility] and is being treated with mometasone nasal washes twice a day after documentation of nasal polyposis. He is no longer using a leukotriene modifier. He did require a systemic steroid for 7 days a few weeks ago for recurrent polyp formation. He still has a decreased ability to smell.  He still continues to have throat clearing and drainage in his throat and intermittent raspy voice. He's no longer using any therapy for reflux. He has used aggressive therapy for reflux in the past with no help regarding this issue and he does not believe that he has reflux at this point especially given that all his diagnostic tests including an upper endoscopy and barium swallow have been negative. He does use Fish oil daily.    Medication List      ALPRAZolam 1 MG tablet Commonly known as:  XANAX Take 1 mg by mouth at bedtime.   amLODipine 2.5 MG tablet Commonly known as:  NORVASC Take 7.5 mg by mouth daily.   aspirin 81 MG tablet Take 162 mg by mouth daily.   BREO ELLIPTA 200-25 MCG/INH  Aepb Generic drug:  fluticasone furoate-vilanterol   Co Q-10 120 MG Caps Take 120 mg by mouth daily.   FISH OIL-KRILL OIL PO Take 1 capsule by mouth 2 (two) times daily.   ibuprofen 200 MG tablet Commonly known as:  ADVIL,MOTRIN Take 600 mg by mouth every 6 (six) hours as needed for moderate pain. Reported on 06/29/2015   KLOR-CON M20 20 MEQ tablet Generic drug:  potassium chloride SA Take 20 mEq by mouth every evening.   losartan-hydrochlorothiazide 100-12.5 MG tablet Commonly known as:  HYZAAR Take 1 tablet by mouth every morning.   metFORMIN 500 MG tablet Commonly known as:  GLUCOPHAGE Take 500 mg by mouth 2 (two) times daily with a meal.   MOMETASONE FUROATE NA Place into the nose. Compounded nasal rinse.   PROAIR RESPICLICK 123XX123 (90 Base) MCG/ACT Aepb Generic drug:  Albuterol Sulfate Inhale two doses every four to six hours as needed for cough or wheeze.       Past Medical History:  Diagnosis Date  . Anxiety   . BPH (benign prostatic hyperplasia)   . Depression   . Diabetes mellitus, type 2 ([place])   . DJD (degenerative joint disease)    bilateral knee's  . GERD (gastroesophageal reflux disease)    "silent reflux"  . Hyperlipidemia   . Hypertension   . Hypokalemia   . Insomnia   . Obesity   . OSA (obstructive sleep apnea) yrs ago  borderline, did not like cpap  . Ulcerative colitis    no recent flares    Past Surgical History:  Procedure Laterality Date  . CHOLECYSTECTOMY  1998  . COLONOSCOPY WITH PROPOFOL N/A 03/18/2013   Procedure: COLONOSCOPY WITH PROPOFOL;  Surgeon: Garlan Fair, MD;  Location: [facility];  Service: Endoscopy;  Laterality: N/A;  . COLONOSCOPY WITH PROPOFOL N/A 03/29/2015   Procedure: COLONOSCOPY WITH PROPOFOL;  Surgeon: Garlan Fair, MD;  Location: [facility];  Service: Endoscopy;  Laterality: N/A;  . CYSTOSCOPY  08/10/2011   Procedure: CYSTOSCOPY;  Surgeon: Dutch Gray, MD;  [facility]: [facility];  Service: Urology;   Laterality: N/A;  . INGUINAL HERNIA REPAIR  1994  . SINUS ENDO W/FUSION Bilateral 07/03/2014   Procedure: BILATERAL ENDOSCOPIC SINUS SURGERY NASAL POLYPECTOMY WITH FUSION SCAN ;  Surgeon: Jerrell Belfast, MD;  Location: [place];  Service: ENT;  Laterality: Bilateral;  . TRANSURETHRAL RESECTION OF PROSTATE  08/10/2011   Procedure: TRANSURETHRAL RESECTION OF THE PROSTATE (TURP);  Surgeon: Dutch Gray, MD;  [facility];  Service: Urology;  Laterality: N/A;    No Known Allergies  Review of systems negative except as noted in HPI / PMHx or noted below:  Review of Systems  Constitutional: Negative.   HENT: Negative.   Eyes: Negative.   Respiratory: Negative.   Cardiovascular: Negative.   Gastrointestinal: Negative.   Genitourinary: Negative.   Musculoskeletal: Negative.   Skin: Negative.   Neurological: Negative.   Endo/Heme/Allergies: Negative.   Psychiatric/Behavioral: Negative.      Objective:   Vitals:   01/25/16 0814  BP: 118/84  Pulse: 64  Resp: 16          Physical Exam  Constitutional: He is well-developed, well-nourished, and in no distress.  Throat clearing, slightly raspy voice  HENT:  Head: Normocephalic.  Right Ear: Tympanic membrane, external ear and ear canal normal.  Left Ear: Tympanic membrane, external ear and ear canal normal.  Nose: Nose normal. No mucosal edema or rhinorrhea.  Mouth/Throat: Uvula is midline, oropharynx is clear and moist and mucous membranes are normal. No oropharyngeal exudate.  Eyes: Conjunctivae are normal.  Neck: Trachea normal. No tracheal tenderness present. No tracheal deviation present. No thyromegaly present.  Cardiovascular: Normal rate, regular rhythm, S1 normal, S2 normal and normal heart sounds.   No murmur heard. Pulmonary/Chest: Breath sounds normal. No stridor. No respiratory distress. He has no wheezes. He has no rales.  Musculoskeletal: He exhibits no edema.  Lymphadenopathy:       Head (right side): No tonsillar  adenopathy present.       Head (left side): No tonsillar adenopathy present.    He has no cervical adenopathy.  Neurological: He is alert. Gait normal.  Skin: No rash noted. He is not diaphoretic. No erythema. Nails show no clubbing.  Psychiatric: Mood and affect normal.    Diagnostics: Results of blood tests obtained on 08/11/2015 referred to a negative ANCA screen, a eosinophil count of 924 and a IgE of 224 IU/mL.  Spirometry was performed and demonstrated an FEV1 of 2.21 at 75 % of predicted.  The patient had an Asthma Control Test with the following results: ACT Total Score: 22.    Assessment and Plan:   1. Moderate persistent asthma, uncomplicated   2. Other allergic rhinitis   3. Laryngopharyngeal reflux (LPR)   4. Sinusitis chronic, ethmoidal     1. Continue Breo 200 one inhalation one time per day  2. Start Montelukast 10mg  one tablet  one time per day  3. Continue mometasone washes as per [facility]  4. Consider stopping fish oil to help throat clearing.  5. Can use antihistamine, nasal saline, pro-air Respiclick if needed  6. Return to clinic in 6 months or earlier if problem   7. 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 I am going to start him on montelukast given the fact that he does have nasal polyposis and still has some activity of his lower airway inflammation even while using Breo. I'm not going to give him a proton pump inhibitor or H2 receptor blocker to treat his LPR but I did make the recommendation that he stop his Fish oil for a month or so to see if this does help with some of his throat issues. He would be a candidate for mepolizumab if he continues to have significant problems in the face of the therapy mentioned above especially given his eosinophilic state. I will see him back in this clinic in 6 months or earlier if there is a problem.  Allena Katz, MD [place]

## 2016-01-25 NOTE — Patient Instructions (Addendum)
  1. Continue Breo 200 one inhalation one time per day  2. Start Montelukast 10mg  one tablet one time per day  3. Continue mometasone washes as per [facility]  4. Consider stopping fish oil to help throat clearing.  5. Can use antihistamine, nasal saline, pro-air Respiclick if needed  6. Return to clinic in 6 months or earlier if problem   7. Obtain flu vaccine

## 2016-01-26 ENCOUNTER — Other Ambulatory Visit: Payer: Self-pay

## 2016-01-26 MED ORDER — RANITIDINE HCL 300 MG PO TABS: 300.0000 mg | ORAL_TABLET | Freq: Every day | ORAL | 3 refills | Status: DC

## 2016-02-04 ENCOUNTER — Encounter: Payer: Self-pay | Admitting: Allergy and Immunology

## 2016-02-07 ENCOUNTER — Ambulatory Visit: Payer: Medicare Other | Admitting: Podiatry

## 2016-02-07 DIAGNOSIS — M79641 Pain in right hand: Secondary | ICD-10-CM | POA: Diagnosis not present

## 2016-02-07 DIAGNOSIS — M79642 Pain in left hand: Secondary | ICD-10-CM | POA: Diagnosis not present

## 2016-02-09 DIAGNOSIS — L820 Inflamed seborrheic keratosis: Secondary | ICD-10-CM | POA: Diagnosis not present

## 2016-02-14 DIAGNOSIS — Z79899 Other long term (current) drug therapy: Secondary | ICD-10-CM | POA: Diagnosis not present

## 2016-02-14 DIAGNOSIS — E119 Type 2 diabetes mellitus without complications: Secondary | ICD-10-CM | POA: Diagnosis not present

## 2016-02-14 DIAGNOSIS — Z7984 Long term (current) use of oral hypoglycemic drugs: Secondary | ICD-10-CM | POA: Diagnosis not present

## 2016-02-14 DIAGNOSIS — I10 Essential (primary) hypertension: Secondary | ICD-10-CM | POA: Diagnosis not present

## 2016-02-14 DIAGNOSIS — G4700 Insomnia, unspecified: Secondary | ICD-10-CM | POA: Diagnosis not present

## 2016-02-14 DIAGNOSIS — J45909 Unspecified asthma, uncomplicated: Secondary | ICD-10-CM | POA: Diagnosis not present

## 2016-02-14 DIAGNOSIS — Z87891 Personal history of nicotine dependence: Secondary | ICD-10-CM | POA: Diagnosis not present

## 2016-02-21 DIAGNOSIS — L820 Inflamed seborrheic keratosis: Secondary | ICD-10-CM | POA: Diagnosis not present

## 2016-02-28 DIAGNOSIS — G4700 Insomnia, unspecified: Secondary | ICD-10-CM | POA: Diagnosis not present

## 2016-02-28 DIAGNOSIS — G4733 Obstructive sleep apnea (adult) (pediatric): Secondary | ICD-10-CM | POA: Diagnosis not present

## 2016-03-01 DIAGNOSIS — Z7984 Long term (current) use of oral hypoglycemic drugs: Secondary | ICD-10-CM | POA: Diagnosis not present

## 2016-03-01 DIAGNOSIS — Z9889 Other specified postprocedural states: Secondary | ICD-10-CM | POA: Diagnosis not present

## 2016-03-01 DIAGNOSIS — I10 Essential (primary) hypertension: Secondary | ICD-10-CM | POA: Diagnosis not present

## 2016-03-01 DIAGNOSIS — K219 Gastro-esophageal reflux disease without esophagitis: Secondary | ICD-10-CM | POA: Diagnosis not present

## 2016-03-01 DIAGNOSIS — Z79899 Other long term (current) drug therapy: Secondary | ICD-10-CM | POA: Diagnosis not present

## 2016-03-01 DIAGNOSIS — J324 Chronic pansinusitis: Secondary | ICD-10-CM | POA: Diagnosis not present

## 2016-03-01 DIAGNOSIS — R062 Wheezing: Secondary | ICD-10-CM | POA: Diagnosis not present

## 2016-03-01 DIAGNOSIS — R05 Cough: Secondary | ICD-10-CM | POA: Diagnosis not present

## 2016-03-01 DIAGNOSIS — E119 Type 2 diabetes mellitus without complications: Secondary | ICD-10-CM | POA: Diagnosis not present

## 2016-03-01 DIAGNOSIS — Z888 Allergy status to other drugs, medicaments and biological substances status: Secondary | ICD-10-CM | POA: Diagnosis not present

## 2016-03-01 DIAGNOSIS — J339 Nasal polyp, unspecified: Secondary | ICD-10-CM | POA: Diagnosis not present

## 2016-03-01 DIAGNOSIS — Z87891 Personal history of nicotine dependence: Secondary | ICD-10-CM | POA: Diagnosis not present

## 2016-03-01 DIAGNOSIS — R7303 Prediabetes: Secondary | ICD-10-CM | POA: Diagnosis not present

## 2016-03-01 DIAGNOSIS — D721 Eosinophilia: Secondary | ICD-10-CM | POA: Diagnosis not present

## 2016-03-02 DIAGNOSIS — Z23 Encounter for immunization: Secondary | ICD-10-CM | POA: Diagnosis not present

## 2016-03-08 DIAGNOSIS — E782 Mixed hyperlipidemia: Secondary | ICD-10-CM | POA: Diagnosis not present

## 2016-03-08 DIAGNOSIS — G4700 Insomnia, unspecified: Secondary | ICD-10-CM | POA: Diagnosis not present

## 2016-03-08 DIAGNOSIS — R253 Fasciculation: Secondary | ICD-10-CM | POA: Diagnosis not present

## 2016-03-08 DIAGNOSIS — Z7984 Long term (current) use of oral hypoglycemic drugs: Secondary | ICD-10-CM | POA: Diagnosis not present

## 2016-03-08 DIAGNOSIS — E119 Type 2 diabetes mellitus without complications: Secondary | ICD-10-CM | POA: Diagnosis not present

## 2016-03-24 ENCOUNTER — Ambulatory Visit (INDEPENDENT_AMBULATORY_CARE_PROVIDER_SITE_OTHER): Payer: Medicare Other | Admitting: Neurology

## 2016-03-24 ENCOUNTER — Encounter: Payer: Self-pay | Admitting: Neurology

## 2016-03-24 VITALS — BP 144/90 | HR 64 | Ht 69.0 in | Wt 181.0 lb

## 2016-03-24 DIAGNOSIS — R253 Fasciculation: Secondary | ICD-10-CM | POA: Diagnosis not present

## 2016-03-24 NOTE — Progress Notes (Signed)
Reason for visit: Muscle fasciculations, weakness  Referring physician: Dr. Leana Gamer [name] is a 70 y.o. male  History of present illness:  Paul Daniels is a 70 year old right-handed white male with a history of muscle fasciculations that have been present for a year and half or 2 years that have affected all 4 extremities. The patient also reports muscle cramps that involved the hands, abdomen, or legs. He has a history of diabetes, but he does not have any numbness associated with a neuropathy. The patient within the last 6 months or so had a partial right biceps tendon rupture, and shortly thereafter he began noticing some weakness and atrophy of the thumb on the right hand. The patient reports no numbness of the hand. He denies any weakness of the lower extremities. The patient denies balance issues or difficulty controlling the bowels or the bladder. He has not had any neck or low back pain. The patient has started trying to work out, he has noted that both arms appear to be somewhat fatigued. He is referred to this office for an evaluation of the muscle fasciculations and weakness. The patient denies any changes in speech or swallowing.  Past Medical History:  Diagnosis Date  . Anxiety   . BPH (benign prostatic hyperplasia)   . Chronic sinusitis   . Depression   . Diabetes mellitus, type 2 ([place])   . DJD (degenerative joint disease)    bilateral knee's  . GERD (gastroesophageal reflux disease)    "silent reflux"  . Hyperlipidemia   . Hypertension   . Hypokalemia   . Insomnia   . Obesity   . OSA (obstructive sleep apnea) yrs ago   borderline, did not like cpap  . Ulcerative colitis    no recent flares    Past Surgical History:  Procedure Laterality Date  . CHOLECYSTECTOMY  1998  . COLONOSCOPY WITH PROPOFOL N/A 03/18/2013   Procedure: COLONOSCOPY WITH PROPOFOL;  Surgeon: Garlan Fair, MD;  [facility];  Service: Endoscopy;  Laterality: N/A;  .  COLONOSCOPY WITH PROPOFOL N/A 03/29/2015   Procedure: COLONOSCOPY WITH PROPOFOL;  Surgeon: Garlan Fair, MD;  [facility];  Service: Endoscopy;  Laterality: N/A;  . CYSTOSCOPY  08/10/2011   Procedure: CYSTOSCOPY;  Surgeon: Dutch Gray, MD;  [facility];  Service: Urology;  Laterality: N/A;  . INGUINAL HERNIA REPAIR  1994  . SINUS ENDO W/FUSION Bilateral 07/03/2014   Procedure: BILATERAL ENDOSCOPIC SINUS SURGERY NASAL POLYPECTOMY WITH FUSION SCAN ;  Surgeon: Jerrell Belfast, MD;  Location: [place];  Service: ENT;  Laterality: Bilateral;  . TRANSURETHRAL RESECTION OF PROSTATE  08/10/2011   Procedure: TRANSURETHRAL RESECTION OF THE PROSTATE (TURP);  Surgeon: Dutch Gray, MD;  [facility];  Service: Urology;  Laterality: N/A;    Family History  Problem Relation Age of Onset  . Asthma Maternal Uncle     uncle  . Heart attack Father     Social history:  reports that he has never smoked. He has never used smokeless tobacco. He reports that he drinks about 11.4 oz of alcohol per week . He reports that he does not use drugs.  Medications:  Prior to Admission medications   Medication Sig Start Date End Date Taking? Authorizing Provider  Albuterol Sulfate (PROAIR RESPICLICK) 123XX123 (90 Base) MCG/ACT AEPB Inhale two doses every four to six hours as needed for cough or wheeze.   Yes Historical Provider, MD  ALPRAZolam Duanne Moron) 1 MG tablet Take 1  mg by mouth at bedtime.  05/31/15  Yes Historical Provider, MD  amLODipine (NORVASC) 2.5 MG tablet Take 7.5 mg by mouth daily.   Yes Historical Provider, MD  aspirin 81 MG tablet Take 162 mg by mouth daily.   Yes Historical Provider, MD  Coenzyme Q10 (CO Q-10) 120 MG CAPS Take 120 mg by mouth daily.   Yes Historical Provider, MD  FISH OIL-KRILL OIL PO Take 1 capsule by mouth 2 (two) times daily.    Yes Historical Provider, MD  fluticasone furoate-vilanterol (BREO ELLIPTA) 200-25 MCG/INH AEPB daily.  06/29/15  Yes Historical Provider, MD    losartan-hydrochlorothiazide (HYZAAR) 100-12.5 MG per tablet Take 1 tablet by mouth every morning.   Yes Historical Provider, MD  metFORMIN (GLUCOPHAGE) 500 MG tablet Take 500 mg by mouth 2 (two) times daily with a meal.   Yes Historical Provider, MD  MOMETASONE FUROATE NA Place into the nose. Compounded nasal rinse.   Yes Historical Provider, MD  potassium chloride SA (KLOR-CON M20) 20 MEQ tablet Take 20 mEq by mouth every evening.    Yes Historical Provider, MD  rosuvastatin (CRESTOR) 5 MG tablet Takes half of tablet- 3x a week 02/16/16  Yes Historical Provider, MD     No Known Allergies  ROS:  Out of a complete 14 system review of symptoms, the patient complains only of the following symptoms, and all other reviewed systems are negative.  Short of breath Weakness Not enough sleep, insomnia  Blood pressure (!) 144/90, pulse 64, height 5\' 9"  (1.753 m), weight 181 lb (82.1 kg).  Physical Exam  General: The patient is alert and cooperative at the time of the examination.  Eyes: Pupils are equal, round, and reactive to light. Discs are flat bilaterally.  Neck: The neck is supple, no carotid bruits are noted.  Respiratory: The respiratory examination is clear.  Cardiovascular: The cardiovascular examination reveals a regular rate and rhythm, no obvious murmurs or rubs are noted.  Neuromuscular: Atrophy of the thenar eminence of the right hand is noted. Fasciculations are prominent throughout both upper extremities, upper chest and back.  Skin: Extremities are without significant edema.  Neurologic Exam  Mental status: The patient is alert and oriented x 3 at the time of the examination. The patient has apparent normal recent and remote memory, with an apparently normal attention span and concentration ability.  Cranial nerves: Facial symmetry is present. There is good sensation of the face to pinprick and soft touch bilaterally. The strength of the facial muscles and the muscles  to head turning and shoulder shrug are normal bilaterally. Speech is well enunciated, no aphasia or dysarthria is noted. Extraocular movements are full. Visual fields are full. The tongue is midline, and the patient has symmetric elevation of the soft palate. No obvious hearing deficits are noted.  Motor: The motor testing reveals 5 over 5 strength of all 4 extremities, with exception of some weakness of the APB muscles bilaterally, intrinsic muscles of the hands bilaterally, supination of the forearms bilaterally and with external rotation of the left arm. Good symmetric motor tone is noted throughout.  Sensory: Sensory testing is intact to pinprick, soft touch, vibration sensation, and position sense on all 4 extremities. No evidence of extinction is noted.  Coordination: Cerebellar testing reveals good finger-nose-finger and heel-to-shin bilaterally.  Gait and station: Gait is normal. Tandem gait is normal. Romberg is negative. No drift is seen.  Reflexes: Deep tendon reflexes are symmetric and normal bilaterally. Toes are downgoing bilaterally.  Assessment/Plan:  1. 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 The patient will be sent for blood work and urinalysis evaluation today, he will have MRI evaluation of the cervical spine, we will set him up for nerve conduction studies on both arms, one leg and EMG of one arm and one leg. He will follow-up for the EMG evaluation.   Jill Alexanders MD 03/24/2016 10:06 AM  [facility] [address] [place], [place] [address]  Phone [phone] Fax [phone]

## 2016-03-26 ENCOUNTER — Encounter: Payer: Self-pay | Admitting: Neurology

## 2016-03-27 ENCOUNTER — Telehealth: Payer: Self-pay

## 2016-03-27 NOTE — Telephone Encounter (Signed)
Called pt and offered EMG appt that has become available for this afternoon. However, pt is headed out of town to [place] and will not return until Friday. Will try to work him in one day next week. He asks that we call his cell phone to reschedule.

## 2016-03-27 NOTE — Telephone Encounter (Signed)
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

## 2016-03-28 ENCOUNTER — Telehealth: Payer: Self-pay | Admitting: Neurology

## 2016-03-28 NOTE — Telephone Encounter (Addendum)
I don't see ANY openings on Dr. Jannifer Franklin' schedule to even try to work around.  Is it okay if he sees another provider to do the EMG or is there perhaps any patients that I could move onto an NP's schedule to make a spot?

## 2016-03-28 NOTE — Telephone Encounter (Signed)
Pt is requesting an appt for MRI asap. The patient is very anxious and wants to have it done asap.

## 2016-03-29 LAB — LYME, WESTERN BLOT, SERUM (REFLEXED)
IgG P23 Ab.: ABSENT
IgM P39 Ab.: ABSENT
IgM P41 Ab.: ABSENT
LYME IGM WB: NEGATIVE
Lyme IgG Wb: POSITIVE — AB

## 2016-03-29 LAB — MULTIPLE MYELOMA PANEL, SERUM
ALBUMIN SERPL ELPH-MCNC: 4.1 g/dL (ref 2.9–4.4)
ALPHA 1: 0.2 g/dL (ref 0.0–0.4)
Albumin/Glob SerPl: 1.2 (ref 0.7–1.7)
Alpha2 Glob SerPl Elph-Mcnc: 0.6 g/dL (ref 0.4–1.0)
B-GLOBULIN SERPL ELPH-MCNC: 1.3 g/dL (ref 0.7–1.3)
Gamma Glob SerPl Elph-Mcnc: 1.4 g/dL (ref 0.4–1.8)
Globulin, Total: 3.5 g/dL (ref 2.2–3.9)
IgA/Immunoglobulin A, Serum: 187 mg/dL (ref 61–437)
IgG (Immunoglobin G), Serum: 1273 mg/dL (ref 700–1600)
IgM (Immunoglobulin M), Srm: 123 mg/dL (ref 20–172)
TOTAL PROTEIN: 7.6 g/dL (ref 6.0–8.5)

## 2016-03-29 LAB — PARANEOPLASTIC PROFILE 1
Neuronal Nuclear (Hu) Antibody (IB): <1:10 {titer}
Purkinje Cell (Yo) Autoantobodies- IFA: <1:10 {titer}

## 2016-03-29 LAB — B. BURGDORFI ANTIBODIES: LYME IGG/IGM AB: 2.12 {ISR} — AB (ref 0.00–0.90)

## 2016-03-29 LAB — ANGIOTENSIN CONVERTING ENZYME: ANGIO CONVERT ENZYME: 25 U/L (ref 14–82)

## 2016-03-29 LAB — SEDIMENTATION RATE: SED RATE: 3 mm/h (ref 0–30)

## 2016-03-29 LAB — CK: CK TOTAL: 164 U/L (ref 24–204)

## 2016-03-29 NOTE — Telephone Encounter (Signed)
Called patient back and informed him that I sent the MRI order to [facility] on 03/24/16 and he said that he hasn't heard anything yet I resent the order and gave him the phone number to [facility] imaging. And he understand.

## 2016-04-03 DIAGNOSIS — R253 Fasciculation: Secondary | ICD-10-CM | POA: Diagnosis not present

## 2016-04-04 ENCOUNTER — Ambulatory Visit
Admission: RE | Admit: 2016-04-04 | Discharge: 2016-04-04 | Disposition: A | Discharge status: Home / Self Care | Payer: Medicare Other | Source: Ambulatory Visit | Attending: Neurology | Admitting: Neurology

## 2016-04-04 ENCOUNTER — Other Ambulatory Visit (HOSPITAL_COMMUNITY): Payer: Self-pay | Admitting: Internal Medicine

## 2016-04-04 ENCOUNTER — Other Ambulatory Visit: Payer: Self-pay | Admitting: Internal Medicine

## 2016-04-04 ENCOUNTER — Ambulatory Visit
Admission: RE | Admit: 2016-04-04 | Discharge: 2016-04-04 | Disposition: A | Discharge status: Home / Self Care | Payer: Medicare Other | Source: Ambulatory Visit | Attending: Internal Medicine | Admitting: Internal Medicine

## 2016-04-04 DIAGNOSIS — J9811 Atelectasis: Secondary | ICD-10-CM | POA: Diagnosis not present

## 2016-04-04 DIAGNOSIS — M50221 Other cervical disc displacement at C4-C5 level: Secondary | ICD-10-CM | POA: Diagnosis not present

## 2016-04-04 DIAGNOSIS — R0600 Dyspnea, unspecified: Secondary | ICD-10-CM

## 2016-04-04 DIAGNOSIS — R253 Fasciculation: Secondary | ICD-10-CM | POA: Diagnosis not present

## 2016-04-04 DIAGNOSIS — R600 Localized edema: Secondary | ICD-10-CM | POA: Diagnosis not present

## 2016-04-04 DIAGNOSIS — M50222 Other cervical disc displacement at C5-C6 level: Secondary | ICD-10-CM | POA: Diagnosis not present

## 2016-04-04 DIAGNOSIS — M50223 Other cervical disc displacement at C6-C7 level: Secondary | ICD-10-CM | POA: Diagnosis not present

## 2016-04-05 ENCOUNTER — Telehealth: Payer: Self-pay | Admitting: Neurology

## 2016-04-05 ENCOUNTER — Encounter: Payer: Self-pay | Admitting: Neurology

## 2016-04-05 ENCOUNTER — Ambulatory Visit: Payer: Medicare Other | Admitting: Allergy and Immunology

## 2016-04-05 DIAGNOSIS — A6920 Lyme disease, unspecified: Secondary | ICD-10-CM

## 2016-04-05 LAB — HEAVY METALS PROFILE, URINE
ARSENIC 24H UR: 55 ug/(24.h) — AB (ref 0–50)
Arsenic Ur: 24 ug/L (ref 0–50)
Arsenic(Inorganic),U: NOT DETECTED ug/L (ref 0–19)
CREATININE(CRT), U: 0.54 g/L (ref 0.30–3.00)
LEAD RANDOM URINE: NOT DETECTED ug/L (ref 0–49)
MERCURY UR: NOT DETECTED ug/L (ref 0–19)

## 2016-04-05 MED ORDER — DOXYCYCLINE HYCLATE 100 MG PO CAPS: 100.0000 mg | ORAL_CAPSULE | Freq: Two times a day (BID) | ORAL | 0 refills | Status: DC

## 2016-04-05 NOTE — Telephone Encounter (Signed)
Dr. Jannifer Franklin just spoke to the patient but the patient needs to speak with him again. He forgot to discuss something with him.

## 2016-04-05 NOTE — Telephone Encounter (Signed)
I called the patient back. He reports some numbness in the morning on the lateral aspect of the right foot. He has recently had some swelling of ankles. He will be having a 2-D echocardiogram soon.

## 2016-04-05 NOTE — Telephone Encounter (Signed)
I called the patient. MRI of the cervical spine shows no evidence of spinal cord compression, there appears to be multilevel neuroforaminal stenosis, could be evidence of nerve root impingement, but this generally would cause pain and numbness, the patient does not report any of this.  Blood work is unremarkable with exception that there appears to be evidence of a Lyme disease infection. I will place the patient on oral antibiotics, he is amenable to undergoing lumbar puncture to exclude a tertiary phase infection. Again, Lyme disease when it affects the nerve roots generally will cause pain and numbness.  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 EMG evaluation is pending.    MRI cervical 04/05/16:  IMPRESSION:  Abnormal MRI cervical spine (without) demonstrating: 1. 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 2. At C3-4, C4-5, C5-6: disc bulging, uncovertebral joint hypertrophy, with severe biforaminal stenosis. 3. At C2-3: left facet hypertrophy and bone spurring with severe left foraminal stenosis. 4. At C6-7: disc bulging, uncovertebral joint hypertrophy, with moderate right and moderate-severe left foraminal stenosis.

## 2016-04-10 DIAGNOSIS — E119 Type 2 diabetes mellitus without complications: Secondary | ICD-10-CM | POA: Diagnosis not present

## 2016-04-10 DIAGNOSIS — Z0100 Encounter for examination of eyes and vision without abnormal findings: Secondary | ICD-10-CM | POA: Diagnosis not present

## 2016-04-11 ENCOUNTER — Encounter: Payer: Self-pay | Admitting: Neurology

## 2016-04-11 ENCOUNTER — Ambulatory Visit (INDEPENDENT_AMBULATORY_CARE_PROVIDER_SITE_OTHER): Payer: Self-pay | Admitting: Neurology

## 2016-04-11 ENCOUNTER — Ambulatory Visit (INDEPENDENT_AMBULATORY_CARE_PROVIDER_SITE_OTHER): Payer: Medicare Other | Admitting: Neurology

## 2016-04-11 DIAGNOSIS — E538 Deficiency of other specified B group vitamins: Secondary | ICD-10-CM

## 2016-04-11 DIAGNOSIS — R253 Fasciculation: Secondary | ICD-10-CM

## 2016-04-11 NOTE — Progress Notes (Signed)
The patient comes into the office today for EMG and nerve conduction study evaluation.  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 Motor and sensory involvement is noted.  Patient will undergo a lumbar puncture to evaluate for tertiary phase Lyme disease.  I will set up a second opinion through [facility] with Dr. Vallarie Mare.  We will see the patient back in 3 months.

## 2016-04-11 NOTE — Procedures (Signed)
HISTORY:  Paul Daniels is a 70 year old patient with onset of muscle fasciculations on all 4 extremities, with weakness of the arms greater than the legs. The patient is being evaluated for a possible anterior horn cell disease process. He reports no numbness of the extremities.  NERVE CONDUCTION STUDIES:  Nerve conduction studies were performed on both upper extremities. The distal motor latencies for the median nerves were prolonged bilaterally, with low motor amplitudes for these nerves bilaterally. The distal motor latencies for the ulnar nerves were prolonged bilaterally with a normal motor amplitude on the right, borderline normal motor amplitude on the left. The F wave latencies for the median and ulnar nerves were prolonged bilaterally, with normal nerve conduction velocities seen for these nerves bilaterally. The sensory latencies for the median and ulnar nerves were prolonged bilaterally.  Nerve conduction studies were performed on the right lower extremity. The distal motor latency for the right peroneal nerve was within normal limits, with a low motor amplitude. The distal motor latency for the right posterior tibial nerve was prolonged, with a low motor amplitude. The nerve conduction velocities for the peroneal and posterior tibial nerves were normal, the F wave latencies for these nerves were prolonged. The H reflex latency was prolonged. The right peroneal sensory latency was slightly prolonged.  EMG STUDIES:  EMG study was performed on the right upper extremity:  The first dorsal interosseous muscle reveals 2 to 5 K units with moderately decreased recruitment. 2+ positive waves were noted. 1+ fasciculations were seen. The abductor pollicis brevis muscle reveals 1 to 2 K units with markedly decreased recruitment. 1+ positive waves were noted. 1+ fasciculations were seen. The extensor indicis proprius muscle reveals 1 to 3 K units with full recruitment. Fast firing units were  seen. No fibrillations or positive waves were noted. 1+ fasciculations were seen. The pronator teres muscle reveals 2 to 3 K units with full recruitment. Fast firing units were seen. 2+ positive waves were noted. 2+ fasciculations were seen. The biceps muscle reveals 1 to 3 K units with full recruitment. No fibrillations or positive waves were noted. 2+ fasciculations were seen. The triceps muscle reveals 2 to 4 K units with full recruitment. No fibrillations or positive waves were noted. 1+ fasciculations were seen. The anterior deltoid muscle reveals 1 to 3 K units with decreased recruitment. No fibrillations or positive waves were noted. 2+ fasciculations were seen. The cervical paraspinal muscles were tested at 2 levels. No abnormalities of insertional activity were seen at either level tested. There was good relaxation.  EMG study was performed on the right lower extremity:  The tibialis anterior muscle reveals 2 to 6K motor units with moderately decreased recruitment. 2+ positive waves were seen. 2+ fasciculations were seen. The peroneus tertius muscle reveals 2 to 4K motor units with decreased recruitment. 2+ positive waves were seen. 1+ fasciculations were seen. The medial gastrocnemius muscle reveals 2 to 5K motor units with full recruitment. 1+ positive waves were seen. 1+ fasciculations were seen. The vastus lateralis muscle reveals 2 to 4K motor units with full recruitment. No fibrillations or positive waves were seen. 1+ fasciculations were seen. The iliopsoas muscle reveals 2 to 4K motor units with full recruitment. No fibrillations or positive waves were seen. 2+ fasciculations were seen.  The biceps femoris muscle (long head) reveals 2 to 3K motor units with full recruitment. No fibrillations or positive waves were seen. 1+ fasciculations were seen. The lumbosacral paraspinal muscles were tested at 3 levels,  and revealed 2+ positive waves at the upper level, 3+ positive waves that the  middle and lower levels. 1+ fasciculations were seen at the middle and lower levels. There was good relaxation.   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 EMG evaluation of the right upper and right lower extremity shows diffuse fasciculations with evidence of acute and chronic denervation in several muscles. In the proper clinical context, this study does show changes that could be consistent with an anterior horn cell disease process.  Jill Alexanders MD 04/11/2016 1:23 PM  [facility] [address] [place], [place] [address]  Phone [phone] Fax [phone]

## 2016-04-11 NOTE — Progress Notes (Signed)
Please refer to EMG and nerve conduction study procedure note [place]

## 2016-04-12 ENCOUNTER — Ambulatory Visit
Admission: RE | Admit: 2016-04-12 | Discharge: 2016-04-12 | Disposition: A | Discharge status: Home / Self Care | Payer: Medicare Other | Source: Ambulatory Visit | Attending: Neurology | Admitting: Neurology

## 2016-04-12 ENCOUNTER — Ambulatory Visit: Payer: Medicare Other | Admitting: Neurology

## 2016-04-12 VITALS — BP 89/68 | HR 70

## 2016-04-12 DIAGNOSIS — A6920 Lyme disease, unspecified: Secondary | ICD-10-CM | POA: Diagnosis not present

## 2016-04-12 LAB — CSF CELL COUNT WITH DIFFERENTIAL
RBC Count, CSF: 150 cells/uL — ABNORMAL HIGH (ref 0–10)
WBC CSF: 2 {cells}/uL (ref 0–5)

## 2016-04-12 LAB — PROTEIN, CSF: Total Protein, CSF: 86 mg/dL — ABNORMAL HIGH (ref 15–60)

## 2016-04-12 LAB — GLUCOSE, CSF: Glucose, CSF: 81 mg/dL — ABNORMAL HIGH (ref 43–76)

## 2016-04-12 NOTE — Progress Notes (Signed)
1 SST tube drawn from left AC to go with spinal fluid..  Site is unremarkable and pt tolerated procedure well. 

## 2016-04-12 NOTE — Discharge Instructions (Signed)

## 2016-04-13 ENCOUNTER — Encounter: Payer: Self-pay | Admitting: Neurology

## 2016-04-13 DIAGNOSIS — G4700 Insomnia, unspecified: Secondary | ICD-10-CM | POA: Diagnosis not present

## 2016-04-13 DIAGNOSIS — R253 Fasciculation: Secondary | ICD-10-CM | POA: Diagnosis not present

## 2016-04-13 DIAGNOSIS — R0600 Dyspnea, unspecified: Secondary | ICD-10-CM | POA: Diagnosis not present

## 2016-04-13 DIAGNOSIS — R600 Localized edema: Secondary | ICD-10-CM | POA: Diagnosis not present

## 2016-04-13 DIAGNOSIS — E119 Type 2 diabetes mellitus without complications: Secondary | ICD-10-CM | POA: Diagnosis not present

## 2016-04-13 LAB — ANA W/REFLEX: Anti Nuclear Antibody(ANA): NEGATIVE

## 2016-04-13 LAB — VITAMIN B12: Vitamin B-12: 389 pg/mL (ref 211–946)

## 2016-04-13 LAB — COPPER, SERUM: COPPER: 94 ug/dL (ref 72–166)

## 2016-04-13 LAB — RHEUMATOID FACTOR: Rhuematoid fact SerPl-aCnc: <10 IU/mL (ref 0.0–13.9)

## 2016-04-14 ENCOUNTER — Telehealth: Payer: Self-pay

## 2016-04-14 NOTE — Telephone Encounter (Signed)
Spoke with patient after he had an LP here 04/12/16, and he states he is doing fine and has no headache. jkl

## 2016-04-16 LAB — VDRL, CSF: SYPHILIS VDRL QUANT CSF: NONREACTIVE

## 2016-04-16 LAB — ANGIOTENSIN CONVERTING ENZYME, CSF: ACE, CSF: 13 U/L (ref <=15)

## 2016-04-17 ENCOUNTER — Ambulatory Visit (HOSPITAL_COMMUNITY)
Admission: RE | Admit: 2016-04-17 | Discharge: 2016-04-17 | Disposition: A | Discharge status: Home / Self Care | Payer: Medicare Other | Source: Ambulatory Visit | Attending: Internal Medicine | Admitting: Internal Medicine

## 2016-04-17 ENCOUNTER — Encounter: Payer: Medicare Other | Admitting: Neurology

## 2016-04-17 ENCOUNTER — Encounter: Payer: Self-pay | Admitting: Neurology

## 2016-04-17 DIAGNOSIS — R0600 Dyspnea, unspecified: Secondary | ICD-10-CM | POA: Diagnosis not present

## 2016-04-17 DIAGNOSIS — G4733 Obstructive sleep apnea (adult) (pediatric): Secondary | ICD-10-CM | POA: Diagnosis not present

## 2016-04-17 LAB — ECHOCARDIOGRAM COMPLETE
CHL CUP MV DEC (S): 296
EERAT: 6.26
EWDT: 296 ms
FS: 30 % (ref 28–44)
IV/PV OW: 1.18
LA diam end sys: 37 mm
LA vol A4C: 48.2 ml
LADIAMINDEX: 1.87 cm/m2
LASIZE: 37 mm
LV E/e' medial: 6.26
LV TDI E'LATERAL: 9.14
LVEEAVG: 6.26
LVELAT: 9.14 cm/s
MVPKAVEL: 84.9 m/s
MVPKEVEL: 57.2 m/s
PW: 15.2 mm — AB (ref 0.6–1.1)
TAPSE: 14.3 mm
TDI e' medial: 6.64

## 2016-04-17 LAB — B. BURGDORFI ANTIBODIES, CSF
ALBUMIN RATIO: 0.0109 — AB
INTERPRETATION: NEGATIVE
Lyme Ab: <1

## 2016-04-17 NOTE — Progress Notes (Signed)
*  PRELIMINARY RESULTS* Echocardiogram 2D Echocardiogram has been performed.  Paul Daniels 04/17/2016, 1:45 PM

## 2016-04-18 ENCOUNTER — Ambulatory Visit (INDEPENDENT_AMBULATORY_CARE_PROVIDER_SITE_OTHER): Payer: Medicare Other | Admitting: Allergy and Immunology

## 2016-04-18 ENCOUNTER — Encounter: Payer: Self-pay | Admitting: Allergy and Immunology

## 2016-04-18 VITALS — BP 118/60 | HR 80 | Resp 20

## 2016-04-18 DIAGNOSIS — G1221 Amyotrophic lateral sclerosis: Secondary | ICD-10-CM

## 2016-04-18 DIAGNOSIS — J3089 Other allergic rhinitis: Secondary | ICD-10-CM | POA: Diagnosis not present

## 2016-04-18 DIAGNOSIS — J4540 Moderate persistent asthma, uncomplicated: Secondary | ICD-10-CM | POA: Diagnosis not present

## 2016-04-18 NOTE — Progress Notes (Signed)
Follow-up Note  Referring Provider: Josetta Huddle, MD Primary Provider: Henrine Screws, MD Date of Office Visit: 04/18/2016  Subjective:   Paul Daniels (DOB: 11/19/1945) is a 70 y.o. male who returns to the [facility] on 04/18/2016 in re-evaluation of the following:  HPI: Sharrieff returns to this clinic in evaluation of his asthma and allergic rhinitis and history chronic sinusitis and history of LPR. I last saw him in his clinic in August 2017.  The big issue for Javier with major implications for his livelihood is the fact that he has been diagnosed tentatively with ALS by Dr. Jannifer Franklin. He is awaiting confirmation of this diagnosis when he gets a second opinion at [facility] this December. In retrospect, over the course of the past 2 years or so he has noticed that he's been developing problems with fasciculations and some weakness and he thinks that some of his breathing issue may also be tied up with this disease state. In addition, he apparently has been diagnosed with oxygen desaturation some time back when he had a sleep study although there was not really any evidence of significant obstructive events. He was given a CPAP machine to use without oxygen which he did not use but last night he had nocturnal oximetry study performed and he is going to use his CPAP machine tonight to have another oximetry study performed to see if indeed a CPAP mask does help his oxygen at night. He apparently had some swallowing studies performed at [facility] and there does not appear to be a significant bulbar involvement with ALS in that he did not develop any significant swallowing difficulties or aspiration.  The only time he really gets any breathing problem at this point is when he does eat a big meal. He thinks that eating a big meal pushes up on his diaphragm and he doesn't have the muscular strains to pull down his diaphragm and expand his chest because of his muscle weakness. He does not use a  short acting bronchodilator to any degree. He does continue to use Breo. He's been using the steroid washes for his upper airways as directed by [facility].  He is also been treated for Lyme disease with doxycycline as apparently his blood tests did identify possible active Lyme although there was no borrelia identified within his CNS.     Medication List      ALPRAZolam 1 MG tablet Commonly known as:  XANAX Take 1 mg by mouth at bedtime.   amLODipine 2.5 MG tablet Commonly known as:  NORVASC Take 7.5 mg by mouth daily.   aspirin 81 MG tablet Take 162 mg by mouth daily.   BREO ELLIPTA 200-25 MCG/INH Aepb Generic drug:  fluticasone furoate-vilanterol daily.   doxycycline 100 MG capsule Commonly known as:  VIBRAMYCIN Take 1 capsule (100 mg total) by mouth 2 (two) times daily.   FISH OIL-KRILL OIL PO Take 1 capsule by mouth 2 (two) times daily.   KLOR-CON M20 20 MEQ tablet Generic drug:  potassium chloride SA Take 20 mEq by mouth every evening.   losartan-hydrochlorothiazide 100-12.5 MG tablet Commonly known as:  HYZAAR Take 1 tablet by mouth every morning.   metFORMIN 500 MG tablet Commonly known as:  GLUCOPHAGE Take 500 mg by mouth 2 (two) times daily with a meal.   MOMETASONE FUROATE NA Place into the nose. Compounded nasal rinse.   PROAIR RESPICLICK 123XX123 (90 Base) MCG/ACT Aepb Generic drug:  Albuterol Sulfate Inhale two doses every four  to six hours as needed for cough or wheeze.   zolpidem 10 MG tablet Commonly known as:  AMBIEN Take 10 mg by mouth.       Past Medical History:  Diagnosis Date  . Anxiety   . BPH (benign prostatic hyperplasia)   . Chronic sinusitis   . Depression   . Diabetes mellitus, type 2 ([place])   . DJD (degenerative joint disease)    bilateral knee's  . GERD (gastroesophageal reflux disease)    "silent reflux"  . Hyperlipidemia   . Hypertension   . Hypokalemia   . Insomnia   . Obesity   . OSA (obstructive sleep apnea) yrs ago    borderline, did not like cpap  . Ulcerative colitis    no recent flares    Past Surgical History:  Procedure Laterality Date  . CHOLECYSTECTOMY  1998  . COLONOSCOPY WITH PROPOFOL N/A 03/18/2013   Procedure: COLONOSCOPY WITH PROPOFOL;  Surgeon: Garlan Fair, MD;  Location: [facility];  Service: Endoscopy;  Laterality: N/A;  . COLONOSCOPY WITH PROPOFOL N/A 03/29/2015   Procedure: COLONOSCOPY WITH PROPOFOL;  Surgeon: Garlan Fair, MD;  [facility];  Service: Endoscopy;  Laterality: N/A;  . CYSTOSCOPY  08/10/2011   Procedure: CYSTOSCOPY;  Surgeon: Dutch Gray, MD;  Location: [facility];  Service: Urology;  Laterality: N/A;  . INGUINAL HERNIA REPAIR  1994  . SINUS ENDO W/FUSION Bilateral 07/03/2014   Procedure: BILATERAL ENDOSCOPIC SINUS SURGERY NASAL POLYPECTOMY WITH FUSION SCAN ;  Surgeon: Jerrell Belfast, MD;  Location: [place];  Service: ENT;  Laterality: Bilateral;  . TRANSURETHRAL RESECTION OF PROSTATE  08/10/2011   Procedure: TRANSURETHRAL RESECTION OF THE PROSTATE (TURP);  Surgeon: Dutch Gray, MD;  [facility];  Service: Urology;  Laterality: N/A;    No Known Allergies  Review of systems negative except as noted in HPI / PMHx or noted below:  Review of Systems  Constitutional: Negative.   HENT: Negative.   Eyes: Negative.   Respiratory: Negative.   Cardiovascular: Negative.   Gastrointestinal: Negative.   Genitourinary: Negative.   Musculoskeletal: Negative.   Skin: Negative.   Neurological: Negative.   Endo/Heme/Allergies: Negative.   Psychiatric/Behavioral: Negative.      Objective:   Vitals:   04/18/16 1116  BP: 118/60  Pulse: 80  Resp: 20          Physical Exam  Constitutional: He is well-developed, well-nourished, and in no distress.  HENT:  Head: Normocephalic.  Right Ear: Tympanic membrane, external ear and ear canal normal.  Left Ear: Tympanic membrane, external ear and ear canal normal.  Nose: Nose normal. No mucosal edema or  rhinorrhea.  Mouth/Throat: Uvula is midline, oropharynx is clear and moist and mucous membranes are normal. No oropharyngeal exudate.  Eyes: Conjunctivae are normal.  Neck: Trachea normal. No tracheal tenderness present. No tracheal deviation present. No thyromegaly present.  Cardiovascular: Normal rate, regular rhythm, S1 normal, S2 normal and normal heart sounds.   No murmur heard. Pulmonary/Chest: Breath sounds normal. No stridor. No respiratory distress. He has no wheezes. He has no rales.  Musculoskeletal: He exhibits no edema.  Lymphadenopathy:       Head (right side): No tonsillar adenopathy present.       Head (left side): No tonsillar adenopathy present.    He has no cervical adenopathy.  Neurological: He is alert. Gait normal.  Skin: No rash noted. He is not diaphoretic. No erythema. Nails show no clubbing.  Psychiatric: Mood and affect normal.  Diagnostics:    Spirometry was performed and demonstrated an FEV1 of 2.08 at 68 % of predicted.  Assessment and Plan:   1. Moderate persistent asthma, uncomplicated   2. Other allergic rhinitis   3. 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 Continue Breo 200 one inhalation one time per day  2. Continue mometasone washes as per [facility]  3. Can use antihistamine, nasal saline, pro-air Respiclick if needed  4. Return to clinic in 6 months or earlier if problem   Obviously Rondle's tentative diagnosis of ALS is going to have major impact on his life. For now we will continue to him to have [facility] and he will continue to use the upper airway therapy prescribed by [facility] to address his upper airway inflammatory condition. There will be much confusion as we move forward regarding why he is having respiratory tract symptoms as there is no doubt that his muscular weakness will contribute to some of his respiratory tract symptoms in conjunction with the inflammatory condition of his respiratory tract which has been in existence for a  while. I informed him that we will be here if there is any issues that he needs addressed. If he does okay we'll see him back in this clinic in 6 months.  Allena Katz, MD [place]

## 2016-04-18 NOTE — Patient Instructions (Signed)
  1. Continue Breo 200 one inhalation one time per day  2. Continue mometasone washes as per [facility]  3. Can use antihistamine, nasal saline, pro-air Respiclick if needed  4. Return to clinic in 6 months or earlier if problem

## 2016-04-23 ENCOUNTER — Encounter: Payer: Self-pay | Admitting: Neurology

## 2016-04-24 ENCOUNTER — Telehealth: Payer: Self-pay | Admitting: Neurology

## 2016-04-24 NOTE — Telephone Encounter (Signed)
I called patient. The patient likely does have ALS. He will be seeing Dr. Vallarie Mare in the near future. The patient has concerns about breathing, he has had a pulmonology evaluation through [facility], he has had CPAP placed which he cannot tolerate. The patient has increased dyspnea after eating, he has had an oxygen saturation test going down to 77 at night. Dr. Inda Merlin has placed him on oxygen at night.  I will move up the appointment with this gentleman, I will see him in the next couple weeks after the appointment with Dr. Vallarie Mare. The patient does not seem to be enthusiastic about initiating Radicava.  He has inquired about assisted suicide. Unfortunately, this is not currently an option in this state.

## 2016-04-25 ENCOUNTER — Other Ambulatory Visit: Payer: Self-pay | Admitting: Internal Medicine

## 2016-04-25 ENCOUNTER — Ambulatory Visit
Admission: RE | Admit: 2016-04-25 | Discharge: 2016-04-25 | Disposition: A | Discharge status: Home / Self Care | Payer: Medicare Other | Source: Ambulatory Visit | Attending: Internal Medicine | Admitting: Internal Medicine

## 2016-04-25 ENCOUNTER — Encounter: Payer: Self-pay | Admitting: Neurology

## 2016-04-25 ENCOUNTER — Other Ambulatory Visit (HOSPITAL_COMMUNITY): Payer: Medicare Other

## 2016-04-25 DIAGNOSIS — G4700 Insomnia, unspecified: Secondary | ICD-10-CM | POA: Diagnosis not present

## 2016-04-25 DIAGNOSIS — R253 Fasciculation: Secondary | ICD-10-CM | POA: Diagnosis not present

## 2016-04-25 DIAGNOSIS — R0902 Hypoxemia: Secondary | ICD-10-CM | POA: Diagnosis not present

## 2016-04-25 DIAGNOSIS — R0600 Dyspnea, unspecified: Secondary | ICD-10-CM | POA: Diagnosis not present

## 2016-04-25 DIAGNOSIS — R0602 Shortness of breath: Secondary | ICD-10-CM | POA: Diagnosis not present

## 2016-04-25 DIAGNOSIS — R600 Localized edema: Secondary | ICD-10-CM | POA: Diagnosis not present

## 2016-04-25 DIAGNOSIS — E119 Type 2 diabetes mellitus without complications: Secondary | ICD-10-CM | POA: Diagnosis not present

## 2016-04-25 NOTE — Telephone Encounter (Signed)
Mssg sent to pt through [facility]. Offered f/u appt on Mon 05/08/16 @ 1:30. Awaiting response.

## 2016-04-26 ENCOUNTER — Telehealth: Payer: Self-pay | Admitting: Allergy and Immunology

## 2016-04-26 NOTE — Telephone Encounter (Signed)
Pt plans on being out of town 12/9 - 12/11. Appt scheduled for 05/10/16 @ 7:30.

## 2016-04-26 NOTE — Telephone Encounter (Signed)
Paul Daniels called and requested to talk directly to you. He said his insurance is refusing to pay for his oxygen at night. They told him the order needed to be written with the word Chronic in front of it. He has Medicare and [place] is who it is going through.

## 2016-04-27 DIAGNOSIS — Z87891 Personal history of nicotine dependence: Secondary | ICD-10-CM | POA: Diagnosis not present

## 2016-04-27 DIAGNOSIS — G1221 Amyotrophic lateral sclerosis: Secondary | ICD-10-CM | POA: Diagnosis not present

## 2016-04-27 DIAGNOSIS — E119 Type 2 diabetes mellitus without complications: Secondary | ICD-10-CM | POA: Diagnosis not present

## 2016-04-27 DIAGNOSIS — I10 Essential (primary) hypertension: Secondary | ICD-10-CM | POA: Diagnosis not present

## 2016-04-27 DIAGNOSIS — R253 Fasciculation: Secondary | ICD-10-CM | POA: Diagnosis not present

## 2016-04-27 DIAGNOSIS — J45909 Unspecified asthma, uncomplicated: Secondary | ICD-10-CM | POA: Diagnosis not present

## 2016-05-03 DIAGNOSIS — G1221 Amyotrophic lateral sclerosis: Secondary | ICD-10-CM | POA: Diagnosis not present

## 2016-05-03 DIAGNOSIS — R29898 Other symptoms and signs involving the musculoskeletal system: Secondary | ICD-10-CM | POA: Diagnosis not present

## 2016-05-10 ENCOUNTER — Ambulatory Visit (INDEPENDENT_AMBULATORY_CARE_PROVIDER_SITE_OTHER): Payer: Medicare Other | Admitting: Neurology

## 2016-05-10 ENCOUNTER — Encounter: Payer: Self-pay | Admitting: Neurology

## 2016-05-10 VITALS — BP 135/84 | HR 66 | Ht 69.0 in | Wt 179.0 lb

## 2016-05-10 DIAGNOSIS — F5101 Primary insomnia: Secondary | ICD-10-CM | POA: Diagnosis not present

## 2016-05-10 DIAGNOSIS — G1221 Amyotrophic lateral sclerosis: Secondary | ICD-10-CM

## 2016-05-10 HISTORY — DX: Amyotrophic lateral sclerosis: G12.21

## 2016-05-10 MED ORDER — MIRTAZAPINE 15 MG PO TABS: 15.0000 mg | ORAL_TABLET | Freq: Every day | ORAL | 1 refills | Status: DC

## 2016-05-10 NOTE — Progress Notes (Signed)
Reason for visit: ALS  Paul Daniels is an 70 y.o. male  History of present illness:  Paul Daniels is a 70 year old right-handed white male with a history of progressive weakness of the upper extremities. EMG and nerve conduction study has suggested the diagnosis of ALS, the patient was seen by Dr. Vallarie Mare at [facility] has confirm the diagnosis. The patient does not wish to go on any medications for ALS such as Rilutek or Radicava. The patient is having issues with breathing, he needs to sleep in an semiupright position, he uses BiPAP at this point. The patient has indicated a has a weak cough, but he is still able to get up phlegm. The patient denies any balance issues or difficulty with falling. He is planning a cruise in January 2018. The patient is concerned about end-of-life issues. I have indicated that we can involve Hospice as his symptoms progress. The patient is not in any pain at this point. He is having difficulty with chronic insomnia that predates the diagnosis of ALS. He is taking 3 mg of Xanax at night and still not sleeping. He has been on trazodone in the past, he believes at a dose of around 150 mg, but without benefit.  Past Medical History:  Diagnosis Date  . ALS (amyotrophic lateral sclerosis) ([place]) 05/10/2016  . Anxiety   . BPH (benign prostatic hyperplasia)   . Chronic sinusitis   . Depression   . Diabetes mellitus, type 2 ([place])   . DJD (degenerative joint disease)    bilateral knee's  . GERD (gastroesophageal reflux disease)    "silent reflux"  . Hyperlipidemia   . Hypertension   . Hypokalemia   . Insomnia   . Obesity   . OSA (obstructive sleep apnea) yrs ago   borderline, did not like cpap  . Ulcerative colitis    no recent flares    Past Surgical History:  Procedure Laterality Date  . CHOLECYSTECTOMY  1998  . COLONOSCOPY WITH PROPOFOL N/A 03/18/2013   Procedure: COLONOSCOPY WITH PROPOFOL;  Surgeon: Garlan Fair, MD;  [facility];  Service:  Endoscopy;  Laterality: N/A;  . COLONOSCOPY WITH PROPOFOL N/A 03/29/2015   Procedure: COLONOSCOPY WITH PROPOFOL;  Surgeon: Garlan Fair, MD;  [facility];  Service: Endoscopy;  Laterality: N/A;  . CYSTOSCOPY  08/10/2011   Procedure: CYSTOSCOPY;  Surgeon: Dutch Gray, MD;  [facility];  Service: Urology;  Laterality: N/A;  . INGUINAL HERNIA REPAIR  1994  . SINUS ENDO W/FUSION Bilateral 07/03/2014   Procedure: BILATERAL ENDOSCOPIC SINUS SURGERY NASAL POLYPECTOMY WITH FUSION SCAN ;  Surgeon: Jerrell Belfast, MD;  Location: [place];  Service: ENT;  Laterality: Bilateral;  . TRANSURETHRAL RESECTION OF PROSTATE  08/10/2011   Procedure: TRANSURETHRAL RESECTION OF THE PROSTATE (TURP);  Surgeon: Dutch Gray, MD;  [facility];  Service: Urology;  Laterality: N/A;    Family History  Problem Relation Age of Onset  . Asthma Maternal Uncle     uncle  . Heart attack Father     Social history:  reports that he has never smoked. He has never used smokeless tobacco. He reports that he drinks about 11.4 oz of alcohol per week . He reports that he does not use drugs.   No Known Allergies  Medications:  Prior to Admission medications   Medication Sig Start Date End Date Taking? Authorizing Provider  Albuterol Sulfate (PROAIR RESPICLICK) 123XX123 (90 Base) MCG/ACT AEPB Inhale two doses every four to six hours  as needed for cough or wheeze.   Yes Historical Provider, MD  ALPRAZolam Duanne Moron) 1 MG tablet Pt is taking 1-3 tablets at bedtime. 05/31/15  Yes Historical Provider, MD  amLODipine (NORVASC) 2.5 MG tablet Take 7.5 mg by mouth daily.   Yes Historical Provider, MD  aspirin 81 MG tablet Take 162 mg by mouth daily.   Yes Historical Provider, MD  doxycycline (VIBRAMYCIN) 100 MG capsule Take 1 capsule (100 mg total) by mouth 2 (two) times daily. 04/05/16  Yes Kathrynn Ducking, MD  FISH OIL-KRILL OIL PO Take 1 capsule by mouth 2 (two) times daily.    Yes Historical Provider, MD  fluticasone  furoate-vilanterol (BREO ELLIPTA) 200-25 MCG/INH AEPB daily.  06/29/15  Yes Historical Provider, MD  losartan-hydrochlorothiazide (HYZAAR) 100-12.5 MG per tablet Take 1 tablet by mouth every morning.   Yes Historical Provider, MD  metFORMIN (GLUCOPHAGE) 500 MG tablet Take 500 mg by mouth 2 (two) times daily with a meal.   Yes Historical Provider, MD  potassium chloride SA (KLOR-CON M20) 20 MEQ tablet Take 20 mEq by mouth every evening.    Yes Historical Provider, MD    ROS:  Out of a complete 14 system review of symptoms, the patient complains only of the following symptoms, and all other reviewed systems are negative.  Fatigue Shortness of breath Insomnia Weakness  Blood pressure 135/84, pulse 66, height 5\' 9"  (1.753 m), weight 179 lb (81.2 kg).  Physical Exam  General: The patient is alert and cooperative at the time of the examination.  Skin: No significant peripheral edema is noted.   Neurologic Exam  Mental status: The patient is alert and oriented x 3 at the time of the examination. The patient has apparent normal recent and remote memory, with an apparently normal attention span and concentration ability.   Cranial nerves: Facial symmetry is present. Speech is normal, no aphasia or dysarthria is noted. Extraocular movements are full. Visual fields are full.  Motor: The patient has good strength in all 4 extremities, with exception of weakness of intrinsic muscles of both hands.  Sensory examination: Soft touch sensation is symmetric on the face, arms, and legs.  Coordination: The patient has good finger-nose-finger and heel-to-shin bilaterally.  Gait and station: The patient has a normal gait. Tandem gait is minimally unsteady. Romberg is negative. No drift is seen.  Reflexes: Deep tendon reflexes are symmetric.   Assessment/Plan:  1. ALS   2. Chronic insomnia  The patient is now on BiPAP, he is having difficulty with insomnia that is a chronic issue for him. I  will add Remeron and evening hours, gradually working up on the dose to see if this is beneficial in addition to the alprazolam. The patient will follow-up in about 3 months, he also has follow-up through [facility].   Jill Alexanders MD 05/10/2016 7:34 AM  [facility] [address] [place], [place] [address]  Phone [phone] Fax [phone]

## 2016-05-13 ENCOUNTER — Encounter: Payer: Self-pay | Admitting: Neurology

## 2016-05-16 ENCOUNTER — Encounter: Payer: Medicare Other | Admitting: Neurology

## 2016-05-16 DIAGNOSIS — G1221 Amyotrophic lateral sclerosis: Secondary | ICD-10-CM | POA: Diagnosis not present

## 2016-05-16 DIAGNOSIS — R0600 Dyspnea, unspecified: Secondary | ICD-10-CM | POA: Diagnosis not present

## 2016-05-16 DIAGNOSIS — G4700 Insomnia, unspecified: Secondary | ICD-10-CM | POA: Diagnosis not present

## 2016-05-16 DIAGNOSIS — J209 Acute bronchitis, unspecified: Secondary | ICD-10-CM | POA: Diagnosis not present

## 2016-05-18 ENCOUNTER — Telehealth: Payer: Self-pay | Admitting: Neurology

## 2016-05-18 ENCOUNTER — Emergency Department (HOSPITAL_COMMUNITY)
Admission: EM | Admit: 2016-05-18 | Discharge: 2016-05-18 | Disposition: A | Discharge status: Home / Self Care | Payer: Medicare Other | Attending: Emergency Medicine | Admitting: Emergency Medicine

## 2016-05-18 ENCOUNTER — Encounter: Payer: Self-pay | Admitting: Neurology

## 2016-05-18 ENCOUNTER — Encounter (HOSPITAL_COMMUNITY): Payer: Self-pay | Admitting: Emergency Medicine

## 2016-05-18 DIAGNOSIS — J45909 Unspecified asthma, uncomplicated: Secondary | ICD-10-CM | POA: Diagnosis not present

## 2016-05-18 DIAGNOSIS — Z7982 Long term (current) use of aspirin: Secondary | ICD-10-CM | POA: Insufficient documentation

## 2016-05-18 DIAGNOSIS — R1013 Epigastric pain: Secondary | ICD-10-CM | POA: Diagnosis not present

## 2016-05-18 DIAGNOSIS — R748 Abnormal levels of other serum enzymes: Secondary | ICD-10-CM | POA: Insufficient documentation

## 2016-05-18 DIAGNOSIS — E119 Type 2 diabetes mellitus without complications: Secondary | ICD-10-CM | POA: Diagnosis not present

## 2016-05-18 DIAGNOSIS — Z7984 Long term (current) use of oral hypoglycemic drugs: Secondary | ICD-10-CM | POA: Insufficient documentation

## 2016-05-18 DIAGNOSIS — R7989 Other specified abnormal findings of blood chemistry: Secondary | ICD-10-CM | POA: Diagnosis not present

## 2016-05-18 DIAGNOSIS — K439 Ventral hernia without obstruction or gangrene: Secondary | ICD-10-CM | POA: Diagnosis not present

## 2016-05-18 DIAGNOSIS — I10 Essential (primary) hypertension: Secondary | ICD-10-CM | POA: Insufficient documentation

## 2016-05-18 DIAGNOSIS — R110 Nausea: Secondary | ICD-10-CM | POA: Diagnosis not present

## 2016-05-18 DIAGNOSIS — K2970 Gastritis, unspecified, without bleeding: Secondary | ICD-10-CM | POA: Diagnosis not present

## 2016-05-18 LAB — CBC
HEMATOCRIT: 39.1 % (ref 39.0–52.0)
Hemoglobin: 13.7 g/dL (ref 13.0–17.0)
MCH: 33 pg (ref 26.0–34.0)
MCHC: 35 g/dL (ref 30.0–36.0)
MCV: 94.2 fL (ref 78.0–100.0)
PLATELETS: 141 10*3/uL — AB (ref 150–400)
RBC: 4.15 MIL/uL — AB (ref 4.22–5.81)
RDW: 12.6 % (ref 11.5–15.5)
WBC: 7.8 10*3/uL (ref 4.0–10.5)

## 2016-05-18 LAB — URINALYSIS, ROUTINE W REFLEX MICROSCOPIC
Bilirubin Urine: NEGATIVE
GLUCOSE, UA: NEGATIVE mg/dL
Hgb urine dipstick: NEGATIVE
KETONES UR: NEGATIVE mg/dL
LEUKOCYTES UA: NEGATIVE
NITRITE: NEGATIVE
PROTEIN: NEGATIVE mg/dL
Specific Gravity, Urine: 1.011 (ref 1.005–1.030)
pH: 5 (ref 5.0–8.0)

## 2016-05-18 LAB — COMPREHENSIVE METABOLIC PANEL
ALT: 19 U/L (ref 17–63)
AST: 22 U/L (ref 15–41)
Albumin: 3.6 g/dL (ref 3.5–5.0)
Alkaline Phosphatase: 38 U/L (ref 38–126)
Anion gap: 12 (ref 5–15)
BILIRUBIN TOTAL: 0.7 mg/dL (ref 0.3–1.2)
BUN: 20 mg/dL (ref 6–20)
CHLORIDE: 97 mmol/L — AB (ref 101–111)
CO2: 28 mmol/L (ref 22–32)
CREATININE: 1.06 mg/dL (ref 0.61–1.24)
Calcium: 9.2 mg/dL (ref 8.9–10.3)
Glucose, Bld: 111 mg/dL — ABNORMAL HIGH (ref 65–99)
POTASSIUM: 3.5 mmol/L (ref 3.5–5.1)
Sodium: 137 mmol/L (ref 135–145)
TOTAL PROTEIN: 6.7 g/dL (ref 6.5–8.1)

## 2016-05-18 LAB — LIPASE, BLOOD: LIPASE: 131 U/L — AB (ref 11–51)

## 2016-05-18 MED ORDER — SODIUM CHLORIDE 0.9 % IV BOLUS (SEPSIS)
1000.0000 mL | Freq: Once | INTRAVENOUS | Status: AC
  Administered 2016-05-18: 1000 mL via INTRAVENOUS

## 2016-05-18 NOTE — ED Provider Notes (Signed)
[facility] DEPT Provider Note   CSN: [id number] Arrival date & time: 05/18/16  1951     History   Chief Complaint Chief Complaint  Patient presents with  . Abdominal Pain    HPI Paul Daniels is a 70 y.o. male.  The history is provided by the patient.  Abdominal Pain   This is a new problem. The current episode started 3 to 5 hours ago. Episode frequency: intermittent. The problem has been gradually improving. The pain is located in the epigastric region (Pt had mild RUQ pain for 3 days leading up to today. That pain has resolved prior to onset of the epigastric pain). The quality of the pain is sharp. The pain is moderate. Associated symptoms include nausea and constipation (possible). Pertinent negatives include fever, belching, diarrhea, hematochezia, melena, vomiting, dysuria, hematuria and headaches. The symptoms are aggravated by palpation. Relieved by: took morphine PO at home. His past medical history is significant for ulcerative colitis (no issues in last 5 years and not on maintenance).    Past Medical History:  Diagnosis Date  . 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 Anxiety   . BPH (benign prostatic hyperplasia)   . Chronic sinusitis   . Depression   . Diabetes mellitus, type 2 ([place])   . DJD (degenerative joint disease)    bilateral knee's  . GERD (gastroesophageal reflux disease)    "silent reflux"  . Hyperlipidemia   . Hypertension   . Hypokalemia   . Insomnia   . Obesity   . OSA (obstructive sleep apnea) yrs ago   borderline, did not like cpap  . Ulcerative colitis    no recent flares    Patient Active Problem List   Diagnosis Date Noted  . 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 Muscle fasciculation 03/24/2016  . Asthma 02/17/2015  . Allergic rhinitis 02/17/2015  . Sinusitis, chronic 07/03/2014    Class: Chronic  . Acute URI 04/06/2014  . Chronic cough 02/10/2014  . Wheezing 02/10/2014  . 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 DIABETES, TYPE 2 12/23/2009  . HYPERLIPIDEMIA 12/23/2009  . HYPOKALEMIA 12/23/2009  . OBESITY 12/23/2009  . ANXIETY 12/23/2009  . DEPRESSION 12/23/2009  . HYPERTENSION 12/23/2009  . UNSPECIFIED ULCERATIVE COLITIS 12/23/2009  . DEGENERATIVE JOINT DISEASE, KNEES, BILATERAL 12/23/2009  . Insomnia 12/23/2009  . SLEEP APNEA 12/23/2009  . BENIGN PROSTATIC HYPERTROPHY, HX OF 12/23/2009    Past Surgical History:  Procedure Laterality Date  . CHOLECYSTECTOMY  1998  . COLONOSCOPY WITH PROPOFOL N/A 03/18/2013   Procedure: COLONOSCOPY WITH PROPOFOL;  Surgeon: Garlan Fair, MD;  Location: [facility];  Service: Endoscopy;  Laterality: N/A;  . COLONOSCOPY WITH PROPOFOL N/A 03/29/2015   Procedure: COLONOSCOPY WITH PROPOFOL;  Surgeon: Garlan Fair, MD;  Location: [facility];  Service: Endoscopy;  Laterality: N/A;  . CYSTOSCOPY  08/10/2011   Procedure: CYSTOSCOPY;  Surgeon: Dutch Gray, MD;  Location: [facility];  Service: Urology;  Laterality: N/A;  . INGUINAL HERNIA REPAIR  1994  . SINUS ENDO W/FUSION Bilateral 07/03/2014   Procedure: BILATERAL ENDOSCOPIC SINUS SURGERY NASAL POLYPECTOMY WITH FUSION SCAN ;  Surgeon: Jerrell Belfast, MD;  Location: [facility];  Service: ENT;  Laterality: Bilateral;  . TRANSURETHRAL RESECTION OF PROSTATE  08/10/2011   Procedure: TRANSURETHRAL RESECTION OF THE PROSTATE (TURP);  Surgeon: Dutch Gray, MD;  Location: [facility];  Service: Urology;  Laterality: N/A;       Home Medications    Prior  to Admission medications   Medication Sig Start Date End Date Taking? Authorizing Provider  Albuterol Sulfate (PROAIR RESPICLICK) [id number] (90 Base) MCG/ACT AEPB Inhale two doses every four to six hours as needed for cough or wheeze.   Yes Historical Provider, MD  ALPRAZolam Duanne Moron) 1 MG tablet Take 1 mg by mouth at bedtime.  05/31/15  Yes Historical Provider, MD  amLODipine (NORVASC) 2.5 MG tablet Take 7.5 mg by mouth daily.    Yes Historical Provider, MD  aspirin 81 MG tablet Take 162 mg by mouth daily.   Yes Historical Provider, MD  FISH OIL-KRILL OIL PO Take 1 capsule by mouth daily.    Yes Historical Provider, MD  fluticasone furoate-vilanterol (BREO ELLIPTA) 200-25 MCG/INH AEPB Inhale 1 puff into the lungs every morning.  06/29/15  Yes Historical Provider, MD  furosemide (LASIX) 20 MG tablet Take 20 mg by mouth daily.   Yes Historical Provider, MD  losartan-hydrochlorothiazide (HYZAAR) 100-12.5 MG per tablet Take 1 tablet by mouth every morning.   Yes Historical Provider, MD  metFORMIN (GLUCOPHAGE) 500 MG tablet Take 500 mg by mouth 2 (two) times daily with a meal.   Yes Historical Provider, MD  mirtazapine (REMERON) 15 MG tablet Take 1 tablet (15 mg total) by mouth at bedtime. 05/10/16  Yes Kathrynn Ducking, MD  potassium chloride SA (KLOR-CON M20) 20 MEQ tablet Take 40 mEq by mouth every evening.    Yes Historical Provider, MD    Family History Family History  Problem Relation Age of Onset  . Asthma Maternal Uncle     uncle  . Heart attack Father     Social History Social History  Substance Use Topics  . Smoking status: Never Smoker  . Smokeless tobacco: Never Used  . Alcohol use 11.4 oz/week    7 Glasses of wine, 12 Shots of liquor per week     Comment: 2-3 oz glasses of wine or scotch daily.      Allergies   Patient has no known allergies.   Review of Systems Review of Systems  Constitutional: Negative for chills and fever.  Respiratory: Negative for cough and shortness of breath.   Cardiovascular: Negative for chest pain.  Gastrointestinal: Positive for abdominal pain, constipation (possible) and nausea. Negative for abdominal distention, blood in stool, diarrhea, hematochezia, melena and vomiting.  Genitourinary: Negative for dysuria, flank pain and hematuria.  Musculoskeletal: Negative for back pain.  Neurological: Negative for headaches.  All other systems reviewed and are  negative.    Physical Exam Updated Vital Signs BP 137/85   Pulse 86   Temp 97.7 F (36.5 C) (Oral)   Resp (!) 27   Ht 5\' 9"  (1.753 m)   Wt 81.2 kg   SpO2 93%   BMI 26.43 kg/m   Physical Exam  Constitutional: He appears well-developed and well-nourished.  HENT:  Head: Normocephalic and atraumatic.  Mouth/Throat: Oropharynx is clear and moist.  Eyes: Conjunctivae are normal.  Neck: Neck supple.  Cardiovascular: Normal rate and regular rhythm.   No murmur heard. Pulmonary/Chest: Effort normal and breath sounds normal. No respiratory distress.  Abdominal: Soft. Normal appearance and bowel sounds are normal. There is no tenderness (no tenderness to deep palpation). There is no rigidity, no CVA tenderness and no tenderness at McBurney's point. A hernia is present. Hernia confirmed positive in the ventral area (small ventral hernia with valsalva but no evidence of incarceration or tenderness).  Musculoskeletal: He exhibits no edema.  Neurological: He is alert.  Skin:  Skin is warm and dry.  Psychiatric: He has a normal mood and affect.  Nursing note and vitals reviewed.    ED Treatments / Results  Labs (all labs ordered are listed, but only abnormal results are displayed) Labs Reviewed  LIPASE, BLOOD - Abnormal; Notable for the following:       Result Value   Lipase 131 (*)    All other components within normal limits  COMPREHENSIVE METABOLIC PANEL - Abnormal; Notable for the following:    Chloride 97 (*)    Glucose, Bld 111 (*)    All other components within normal limits  CBC - Abnormal; Notable for the following:    RBC 4.15 (*)    Platelets 141 (*)    All other components within normal limits  URINALYSIS, ROUTINE W REFLEX MICROSCOPIC - Abnormal; Notable for the following:    Color, Urine STRAW (*)    All other components within normal limits    EKG  EKG Interpretation None       Radiology No results found.  Procedures Procedures (including critical care  time)  Medications Ordered in ED Medications  sodium chloride 0.9 % bolus 1,000 mL (0 mLs Intravenous Stopped 05/18/16 2145)  sodium chloride 0.9 % bolus 1,000 mL (1,000 mLs Intravenous New Bag/Given 05/18/16 2145)     Initial Impression / Assessment and Plan / ED Course  I have reviewed the triage vital signs and the nursing notes.  Pertinent labs & imaging results that were available during my care of the patient were reviewed by me and considered in my medical decision making (see chart for details).  Clinical Course    Patient is a 70 year old male with history of recent diarrheal noticed ALS, remote history of ulcerative colitis, diabetes, hypertension, hyperlipidemia who presents with 3 days of mild right upper quadrant pain followed by few hours of intermittent epigastric pain with nausea. Patient denies any vomiting or diarrhea.  Patient went for most of the day without any abdominal pain after the right upper quadrant pain resolved prior to the onset of the epigastric pain. Patient took by mouth morphine prior to arrival. Patient states on the way over here the pain was subsiding and by the time I spoke with him he states his pain is currently a 0 out of 10. Abdominal exam shows no abdominal tenderness even to deep palpation.  Differential includes gastritis, pancreatitis, partial SBO, functional abdominal pain. Most likely mild pancreatitis with element of functional abd pain.  Patient states he felt some gas move around in his abdomen and the pain went away. Patient is attributing his pain to constipation versus gas. Patient observed in the ED without any further episodes of abdominal pain or nausea. Labs obtained which showed a leukocytosis, normal liver enzymes but a lipase of 131.  Patient may have mild pancreatitis however in the absence of any current symptoms, no leukocytosis or fever or any active nausea or vomiting patient offered either further IV fluid rehydration in the  ED and discharge home versus admission for observation. Patient states he does not want to be admitted and he may return to the hospital if his symptoms come back or worsen. No need for emergent CT scan at this time.  Patient given total of 2 L of saline and given by mouth hydration as well. Symptoms never returned. Patient is given strict return precautions. Patient to call PCP in the morning.  Patient discharged in good condition. Patient seen with attending, Dr. Ashok Cordia.  Final  Clinical Impressions(s) / ED Diagnoses   Final diagnoses:  Epigastric pain  Elevated lipase    New Prescriptions New Prescriptions   No medications on file     Tobie Poet, DO 05/18/16 2221    Lajean Saver, MD 05/23/16 1248

## 2016-05-18 NOTE — Telephone Encounter (Signed)
The patient will be traveling in January, he needs an early refill for the mirtazapine. I called the pharmacy and indicated that he may have an early refill.

## 2016-05-18 NOTE — ED Triage Notes (Signed)
Per EMS pt had sudden onset of upper abdominal pain, pt has ALS, took morphine around 5:45 4mg . Pt had colitis, has not had problems or pain related to this for 5 years

## 2016-05-31 DIAGNOSIS — G1221 Amyotrophic lateral sclerosis: Secondary | ICD-10-CM | POA: Diagnosis not present

## 2016-05-31 DIAGNOSIS — K51019 Ulcerative (chronic) pancolitis with unspecified complications: Secondary | ICD-10-CM | POA: Diagnosis not present

## 2016-06-05 ENCOUNTER — Ambulatory Visit: Payer: Medicare Other | Admitting: Licensed Clinical Social Worker

## 2016-06-05 ENCOUNTER — Encounter: Payer: Self-pay | Admitting: Neurology

## 2016-06-06 ENCOUNTER — Other Ambulatory Visit: Payer: Self-pay | Admitting: Neurology

## 2016-06-06 MED ORDER — MIRTAZAPINE 30 MG PO TABS: 30.0000 mg | ORAL_TABLET | Freq: Every day | ORAL | 1 refills | Status: AC

## 2016-06-22 DIAGNOSIS — R531 Weakness: Secondary | ICD-10-CM | POA: Diagnosis not present

## 2016-06-27 DIAGNOSIS — G1221 Amyotrophic lateral sclerosis: Secondary | ICD-10-CM | POA: Diagnosis not present

## 2016-06-27 DIAGNOSIS — K51019 Ulcerative (chronic) pancolitis with unspecified complications: Secondary | ICD-10-CM | POA: Diagnosis not present

## 2016-06-29 DIAGNOSIS — R531 Weakness: Secondary | ICD-10-CM | POA: Diagnosis not present

## 2016-06-30 DIAGNOSIS — N281 Cyst of kidney, acquired: Secondary | ICD-10-CM | POA: Diagnosis not present

## 2016-06-30 DIAGNOSIS — R350 Frequency of micturition: Secondary | ICD-10-CM | POA: Diagnosis not present

## 2016-06-30 DIAGNOSIS — N401 Enlarged prostate with lower urinary tract symptoms: Secondary | ICD-10-CM | POA: Diagnosis not present

## 2016-06-30 DIAGNOSIS — R3914 Feeling of incomplete bladder emptying: Secondary | ICD-10-CM | POA: Diagnosis not present

## 2016-07-05 DIAGNOSIS — G1221 Amyotrophic lateral sclerosis: Secondary | ICD-10-CM | POA: Diagnosis not present

## 2016-07-05 DIAGNOSIS — K219 Gastro-esophageal reflux disease without esophagitis: Secondary | ICD-10-CM | POA: Diagnosis not present

## 2016-07-05 DIAGNOSIS — J324 Chronic pansinusitis: Secondary | ICD-10-CM | POA: Diagnosis not present

## 2016-07-05 DIAGNOSIS — E119 Type 2 diabetes mellitus without complications: Secondary | ICD-10-CM | POA: Diagnosis not present

## 2016-07-05 DIAGNOSIS — R1310 Dysphagia, unspecified: Secondary | ICD-10-CM | POA: Diagnosis not present

## 2016-07-05 DIAGNOSIS — Z7984 Long term (current) use of oral hypoglycemic drugs: Secondary | ICD-10-CM | POA: Diagnosis not present

## 2016-07-05 DIAGNOSIS — R6889 Other general symptoms and signs: Secondary | ICD-10-CM | POA: Diagnosis not present

## 2016-07-05 DIAGNOSIS — J3489 Other specified disorders of nose and nasal sinuses: Secondary | ICD-10-CM | POA: Diagnosis not present

## 2016-07-05 DIAGNOSIS — Z87891 Personal history of nicotine dependence: Secondary | ICD-10-CM | POA: Diagnosis not present

## 2016-07-05 DIAGNOSIS — Z79899 Other long term (current) drug therapy: Secondary | ICD-10-CM | POA: Diagnosis not present

## 2016-07-05 DIAGNOSIS — I10 Essential (primary) hypertension: Secondary | ICD-10-CM | POA: Diagnosis not present

## 2016-07-07 ENCOUNTER — Other Ambulatory Visit: Payer: Self-pay | Admitting: Allergy and Immunology

## 2016-07-10 DIAGNOSIS — R338 Other retention of urine: Secondary | ICD-10-CM | POA: Diagnosis not present

## 2016-07-18 ENCOUNTER — Ambulatory Visit (INDEPENDENT_AMBULATORY_CARE_PROVIDER_SITE_OTHER): Payer: Medicare Other | Admitting: Neurology

## 2016-07-18 ENCOUNTER — Encounter: Payer: Self-pay | Admitting: Neurology

## 2016-07-18 VITALS — BP 131/81 | HR 90 | Ht 69.0 in | Wt 167.0 lb

## 2016-07-18 DIAGNOSIS — G1221 Amyotrophic lateral sclerosis: Secondary | ICD-10-CM | POA: Diagnosis not present

## 2016-07-18 NOTE — Progress Notes (Signed)
Reason for visit: ALS  Paul Daniels is an 71 y.o. male  History of present illness:  Paul Daniels is a 71 year old right-handed white male with a history of ALS. The patient has had ongoing shortness of breath and he has weakness in both hands. The patient is having increasing problems with doing things with his hands, he is having problems with handwriting. He has shortness of breath with physical activity which limits what he can do. The patient also has some problems with urinary retention, he has required an indwelling catheterization previously. The patient indicates that if he talks too long he gets increased shortness of breath. The patient has not had any falls, he has good strength in the legs, he is fully ambulatory. He returns to this office for further evaluation.  Past Medical History:  Diagnosis Date  . 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 Anxiety   . BPH (benign prostatic hyperplasia)   . Chronic sinusitis   . Depression   . Diabetes mellitus, type 2 ([place])   . DJD (degenerative joint disease)    bilateral knee's  . GERD (gastroesophageal reflux disease)    "silent reflux"  . Hyperlipidemia   . Hypertension   . Hypokalemia   . Insomnia   . Obesity   . OSA (obstructive sleep apnea) yrs ago   borderline, did not like cpap  . Ulcerative colitis    no recent flares    Past Surgical History:  Procedure Laterality Date  . CHOLECYSTECTOMY  1998  . COLONOSCOPY WITH PROPOFOL N/A 03/18/2013   Procedure: COLONOSCOPY WITH PROPOFOL;  Surgeon: Garlan Fair, MD;  [facility];  Service: Endoscopy;  Laterality: N/A;  . COLONOSCOPY WITH PROPOFOL N/A 03/29/2015   Procedure: COLONOSCOPY WITH PROPOFOL;  Surgeon: Garlan Fair, MD;  [facility];  Service: Endoscopy;  Laterality: N/A;  . CYSTOSCOPY  08/10/2011   Procedure: CYSTOSCOPY;  Surgeon: Dutch Gray, MD;  [facility];  Service: Urology;  Laterality: N/A;  . INGUINAL HERNIA  REPAIR  1994  . SINUS ENDO W/FUSION Bilateral 07/03/2014   Procedure: BILATERAL ENDOSCOPIC SINUS SURGERY NASAL POLYPECTOMY WITH FUSION SCAN ;  Surgeon: Jerrell Belfast, MD;  Location: [facility];  Service: ENT;  Laterality: Bilateral;  . TRANSURETHRAL RESECTION OF PROSTATE  08/10/2011   Procedure: TRANSURETHRAL RESECTION OF THE PROSTATE (TURP);  Surgeon: Dutch Gray, MD;  Location[facility];  Service: Urology;  Laterality: N/A;    Family History  Problem Relation Age of Onset  . Asthma Maternal Uncle     uncle  . Heart attack Father     Social history:  reports that he has never smoked. He has never used smokeless tobacco. He reports that he drinks about 11.4 oz of alcohol per week . He reports that he does not use drugs.   No Known Allergies  Medications:  Prior to Admission medications   Medication Sig Start Date End Date Taking? Authorizing Provider  Albuterol Sulfate (PROAIR RESPICLICK) 123XX123 (90 Base) MCG/ACT AEPB Inhale two doses every four to six hours as needed for cough or wheeze.   Yes Historical Provider, MD  ALPRAZolam Duanne Moron) 1 MG tablet Take 1 mg by mouth at bedtime.  05/31/15  Yes Historical Provider, MD  amLODipine (NORVASC) 2.5 MG tablet Take 7.5 mg by mouth daily.   Yes Historical Provider, MD  aspirin 81 MG tablet Take 162 mg by mouth daily.   Yes Historical Provider, MD  BREO ELLIPTA 200-25 MCG/INH AEPB INHALE  1 PUFF ONE TIME DAILY 07/07/16  Yes Jiles Prows, MD  FISH OIL-KRILL OIL PO Take 1 capsule by mouth daily.    Yes Historical Provider, MD  furosemide (LASIX) 20 MG tablet Take 20 mg by mouth daily as needed.    Yes Historical Provider, MD  losartan-hydrochlorothiazide (HYZAAR) 100-12.5 MG per tablet Take 1 tablet by mouth every morning.   Yes Historical Provider, MD  metFORMIN (GLUCOPHAGE) 500 MG tablet Take 500 mg by mouth 2 (two) times daily with a meal.   Yes Historical Provider, MD  mirtazapine (REMERON) 30 MG tablet Take 1 tablet (30 mg total) by mouth at bedtime. 06/06/16   Yes Kathrynn Ducking, MD  Morphine Sulfate ER 30 MG T12A Take by mouth. Every 24hour prn   Yes Historical Provider, MD  potassium chloride SA (KLOR-CON M20) 20 MEQ tablet Take 40 mEq by mouth every evening.    Yes Historical Provider, MD  PRESCRIPTION MEDICATION Morphine liquid 100mg /5 ml.  Pt taking 57ml every 12 hours prn   Yes Historical Provider, MD    ROS:  Out of a complete 14 system review of symptoms, the patient complains only of the following symptoms, and all other reviewed systems are negative.  Decreased activity Cough, shortness of breath Difficulty urinating Weakness  Blood pressure 131/81, pulse 90, height 5\' 9"  (1.753 m), weight 167 lb (75.8 kg).  Physical Exam  General: The patient is alert and cooperative at the time of the examination.  Skin: No significant peripheral edema is noted.   Neurologic Exam  Mental status: The patient is alert and oriented x 3 at the time of the examination. The patient has apparent normal recent and remote memory, with an apparently normal attention span and concentration ability.   Cranial nerves: Facial symmetry is present. Speech is normal, no aphasia or dysarthria is noted. Extraocular movements are full. Visual fields are full.  Motor: The patient has good strength in all 4 extremities, with exception of weakness with the intrinsic muscles of the hands bilaterally.  Sensory examination: Soft touch sensation is symmetric on the face, arms, and legs.  Coordination: The patient has good finger-nose-finger and heel-to-shin bilaterally.  Gait and station: The patient has a normal gait. Tandem gait is normal. Romberg is negative. No drift is seen.  Reflexes: Deep tendon reflexes are symmetric.   Assessment/Plan:  1. ALS  The patient indicates that he is progressing with his ability to breathe and with use of his hands. His strength of the extremities seems to be remaining relatively stable. He questions when to get hospice  involved, he is still relatively independent, I would probably hold off on this until he requires assistance with some activities of daily living. The patient will follow-up in about 5 months, sooner if needed. He indicates that he has a poor appetite, he will try to eat better, he has been losing weight. He is not choking with swallowing.  Jill Alexanders MD 07/18/2016 12:30 PM  [facility] [address] [place], [place] [address]  Phone [phone] Fax [phone]

## 2016-07-19 DIAGNOSIS — R3914 Feeling of incomplete bladder emptying: Secondary | ICD-10-CM | POA: Diagnosis not present

## 2016-07-21 DIAGNOSIS — R531 Weakness: Secondary | ICD-10-CM | POA: Diagnosis not present

## 2016-07-23 DIAGNOSIS — E785 Hyperlipidemia, unspecified: Secondary | ICD-10-CM | POA: Diagnosis not present

## 2016-07-23 DIAGNOSIS — E119 Type 2 diabetes mellitus without complications: Secondary | ICD-10-CM | POA: Diagnosis not present

## 2016-07-23 DIAGNOSIS — I10 Essential (primary) hypertension: Secondary | ICD-10-CM | POA: Diagnosis not present

## 2016-07-23 DIAGNOSIS — F411 Generalized anxiety disorder: Secondary | ICD-10-CM | POA: Diagnosis not present

## 2016-07-23 DIAGNOSIS — R339 Retention of urine, unspecified: Secondary | ICD-10-CM | POA: Diagnosis not present

## 2016-07-23 DIAGNOSIS — N401 Enlarged prostate with lower urinary tract symptoms: Secondary | ICD-10-CM | POA: Diagnosis not present

## 2016-07-23 DIAGNOSIS — R0600 Dyspnea, unspecified: Secondary | ICD-10-CM | POA: Diagnosis not present

## 2016-07-23 DIAGNOSIS — F339 Major depressive disorder, recurrent, unspecified: Secondary | ICD-10-CM | POA: Diagnosis not present

## 2016-07-23 DIAGNOSIS — J42 Unspecified chronic bronchitis: Secondary | ICD-10-CM | POA: Diagnosis not present

## 2016-07-23 DIAGNOSIS — G1221 Amyotrophic lateral sclerosis: Secondary | ICD-10-CM | POA: Diagnosis not present

## 2016-07-23 DIAGNOSIS — G4730 Sleep apnea, unspecified: Secondary | ICD-10-CM | POA: Diagnosis not present

## 2016-07-24 DIAGNOSIS — I10 Essential (primary) hypertension: Secondary | ICD-10-CM | POA: Diagnosis not present

## 2016-07-24 DIAGNOSIS — J42 Unspecified chronic bronchitis: Secondary | ICD-10-CM | POA: Diagnosis not present

## 2016-07-24 DIAGNOSIS — G1221 Amyotrophic lateral sclerosis: Secondary | ICD-10-CM | POA: Diagnosis not present

## 2016-07-24 DIAGNOSIS — E119 Type 2 diabetes mellitus without complications: Secondary | ICD-10-CM | POA: Diagnosis not present

## 2016-07-24 DIAGNOSIS — E785 Hyperlipidemia, unspecified: Secondary | ICD-10-CM | POA: Diagnosis not present

## 2016-07-24 DIAGNOSIS — G4730 Sleep apnea, unspecified: Secondary | ICD-10-CM | POA: Diagnosis not present

## 2016-07-25 DIAGNOSIS — E785 Hyperlipidemia, unspecified: Secondary | ICD-10-CM | POA: Diagnosis not present

## 2016-07-25 DIAGNOSIS — J42 Unspecified chronic bronchitis: Secondary | ICD-10-CM | POA: Diagnosis not present

## 2016-07-25 DIAGNOSIS — G1221 Amyotrophic lateral sclerosis: Secondary | ICD-10-CM | POA: Diagnosis not present

## 2016-07-25 DIAGNOSIS — G4730 Sleep apnea, unspecified: Secondary | ICD-10-CM | POA: Diagnosis not present

## 2016-07-25 DIAGNOSIS — I10 Essential (primary) hypertension: Secondary | ICD-10-CM | POA: Diagnosis not present

## 2016-07-25 DIAGNOSIS — E119 Type 2 diabetes mellitus without complications: Secondary | ICD-10-CM | POA: Diagnosis not present

## 2016-07-27 DIAGNOSIS — E119 Type 2 diabetes mellitus without complications: Secondary | ICD-10-CM | POA: Diagnosis not present

## 2016-07-27 DIAGNOSIS — R0600 Dyspnea, unspecified: Secondary | ICD-10-CM | POA: Diagnosis not present

## 2016-07-27 DIAGNOSIS — F411 Generalized anxiety disorder: Secondary | ICD-10-CM | POA: Diagnosis not present

## 2016-07-27 DIAGNOSIS — I10 Essential (primary) hypertension: Secondary | ICD-10-CM | POA: Diagnosis not present

## 2016-07-27 DIAGNOSIS — R339 Retention of urine, unspecified: Secondary | ICD-10-CM | POA: Diagnosis not present

## 2016-07-27 DIAGNOSIS — F339 Major depressive disorder, recurrent, unspecified: Secondary | ICD-10-CM | POA: Diagnosis not present

## 2016-07-27 DIAGNOSIS — G4730 Sleep apnea, unspecified: Secondary | ICD-10-CM | POA: Diagnosis not present

## 2016-07-27 DIAGNOSIS — G1221 Amyotrophic lateral sclerosis: Secondary | ICD-10-CM | POA: Diagnosis not present

## 2016-07-27 DIAGNOSIS — J42 Unspecified chronic bronchitis: Secondary | ICD-10-CM | POA: Diagnosis not present

## 2016-07-27 DIAGNOSIS — E785 Hyperlipidemia, unspecified: Secondary | ICD-10-CM | POA: Diagnosis not present

## 2016-07-27 DIAGNOSIS — N401 Enlarged prostate with lower urinary tract symptoms: Secondary | ICD-10-CM | POA: Diagnosis not present

## 2016-08-03 DIAGNOSIS — E785 Hyperlipidemia, unspecified: Secondary | ICD-10-CM | POA: Diagnosis not present

## 2016-08-03 DIAGNOSIS — I10 Essential (primary) hypertension: Secondary | ICD-10-CM | POA: Diagnosis not present

## 2016-08-03 DIAGNOSIS — G1221 Amyotrophic lateral sclerosis: Secondary | ICD-10-CM | POA: Diagnosis not present

## 2016-08-03 DIAGNOSIS — J42 Unspecified chronic bronchitis: Secondary | ICD-10-CM | POA: Diagnosis not present

## 2016-08-03 DIAGNOSIS — E119 Type 2 diabetes mellitus without complications: Secondary | ICD-10-CM | POA: Diagnosis not present

## 2016-08-03 DIAGNOSIS — G4730 Sleep apnea, unspecified: Secondary | ICD-10-CM | POA: Diagnosis not present

## 2016-08-07 DIAGNOSIS — J42 Unspecified chronic bronchitis: Secondary | ICD-10-CM | POA: Diagnosis not present

## 2016-08-07 DIAGNOSIS — I10 Essential (primary) hypertension: Secondary | ICD-10-CM | POA: Diagnosis not present

## 2016-08-07 DIAGNOSIS — G4730 Sleep apnea, unspecified: Secondary | ICD-10-CM | POA: Diagnosis not present

## 2016-08-07 DIAGNOSIS — E785 Hyperlipidemia, unspecified: Secondary | ICD-10-CM | POA: Diagnosis not present

## 2016-08-07 DIAGNOSIS — G1221 Amyotrophic lateral sclerosis: Secondary | ICD-10-CM | POA: Diagnosis not present

## 2016-08-07 DIAGNOSIS — E119 Type 2 diabetes mellitus without complications: Secondary | ICD-10-CM | POA: Diagnosis not present

## 2016-08-08 DIAGNOSIS — J42 Unspecified chronic bronchitis: Secondary | ICD-10-CM | POA: Diagnosis not present

## 2016-08-08 DIAGNOSIS — G4730 Sleep apnea, unspecified: Secondary | ICD-10-CM | POA: Diagnosis not present

## 2016-08-08 DIAGNOSIS — E119 Type 2 diabetes mellitus without complications: Secondary | ICD-10-CM | POA: Diagnosis not present

## 2016-08-08 DIAGNOSIS — G1221 Amyotrophic lateral sclerosis: Secondary | ICD-10-CM | POA: Diagnosis not present

## 2016-08-08 DIAGNOSIS — E785 Hyperlipidemia, unspecified: Secondary | ICD-10-CM | POA: Diagnosis not present

## 2016-08-08 DIAGNOSIS — I10 Essential (primary) hypertension: Secondary | ICD-10-CM | POA: Diagnosis not present

## 2016-08-09 DIAGNOSIS — J42 Unspecified chronic bronchitis: Secondary | ICD-10-CM | POA: Diagnosis not present

## 2016-08-09 DIAGNOSIS — E119 Type 2 diabetes mellitus without complications: Secondary | ICD-10-CM | POA: Diagnosis not present

## 2016-08-09 DIAGNOSIS — G4730 Sleep apnea, unspecified: Secondary | ICD-10-CM | POA: Diagnosis not present

## 2016-08-09 DIAGNOSIS — E785 Hyperlipidemia, unspecified: Secondary | ICD-10-CM | POA: Diagnosis not present

## 2016-08-09 DIAGNOSIS — I10 Essential (primary) hypertension: Secondary | ICD-10-CM | POA: Diagnosis not present

## 2016-08-09 DIAGNOSIS — G1221 Amyotrophic lateral sclerosis: Secondary | ICD-10-CM | POA: Diagnosis not present

## 2016-08-10 DIAGNOSIS — G1221 Amyotrophic lateral sclerosis: Secondary | ICD-10-CM | POA: Diagnosis not present

## 2016-08-10 DIAGNOSIS — E119 Type 2 diabetes mellitus without complications: Secondary | ICD-10-CM | POA: Diagnosis not present

## 2016-08-10 DIAGNOSIS — E785 Hyperlipidemia, unspecified: Secondary | ICD-10-CM | POA: Diagnosis not present

## 2016-08-10 DIAGNOSIS — G4730 Sleep apnea, unspecified: Secondary | ICD-10-CM | POA: Diagnosis not present

## 2016-08-10 DIAGNOSIS — I10 Essential (primary) hypertension: Secondary | ICD-10-CM | POA: Diagnosis not present

## 2016-08-10 DIAGNOSIS — J42 Unspecified chronic bronchitis: Secondary | ICD-10-CM | POA: Diagnosis not present

## 2016-08-12 DIAGNOSIS — G4730 Sleep apnea, unspecified: Secondary | ICD-10-CM | POA: Diagnosis not present

## 2016-08-12 DIAGNOSIS — E119 Type 2 diabetes mellitus without complications: Secondary | ICD-10-CM | POA: Diagnosis not present

## 2016-08-12 DIAGNOSIS — I10 Essential (primary) hypertension: Secondary | ICD-10-CM | POA: Diagnosis not present

## 2016-08-12 DIAGNOSIS — J42 Unspecified chronic bronchitis: Secondary | ICD-10-CM | POA: Diagnosis not present

## 2016-08-12 DIAGNOSIS — G1221 Amyotrophic lateral sclerosis: Secondary | ICD-10-CM | POA: Diagnosis not present

## 2016-08-12 DIAGNOSIS — E785 Hyperlipidemia, unspecified: Secondary | ICD-10-CM | POA: Diagnosis not present

## 2016-08-13 DIAGNOSIS — I10 Essential (primary) hypertension: Secondary | ICD-10-CM | POA: Diagnosis not present

## 2016-08-13 DIAGNOSIS — G4730 Sleep apnea, unspecified: Secondary | ICD-10-CM | POA: Diagnosis not present

## 2016-08-13 DIAGNOSIS — G1221 Amyotrophic lateral sclerosis: Secondary | ICD-10-CM | POA: Diagnosis not present

## 2016-08-13 DIAGNOSIS — E119 Type 2 diabetes mellitus without complications: Secondary | ICD-10-CM | POA: Diagnosis not present

## 2016-08-13 DIAGNOSIS — J42 Unspecified chronic bronchitis: Secondary | ICD-10-CM | POA: Diagnosis not present

## 2016-08-13 DIAGNOSIS — E785 Hyperlipidemia, unspecified: Secondary | ICD-10-CM | POA: Diagnosis not present

## 2016-08-14 DIAGNOSIS — G4730 Sleep apnea, unspecified: Secondary | ICD-10-CM | POA: Diagnosis not present

## 2016-08-14 DIAGNOSIS — I10 Essential (primary) hypertension: Secondary | ICD-10-CM | POA: Diagnosis not present

## 2016-08-14 DIAGNOSIS — E119 Type 2 diabetes mellitus without complications: Secondary | ICD-10-CM | POA: Diagnosis not present

## 2016-08-14 DIAGNOSIS — E785 Hyperlipidemia, unspecified: Secondary | ICD-10-CM | POA: Diagnosis not present

## 2016-08-14 DIAGNOSIS — G1221 Amyotrophic lateral sclerosis: Secondary | ICD-10-CM | POA: Diagnosis not present

## 2016-08-14 DIAGNOSIS — J42 Unspecified chronic bronchitis: Secondary | ICD-10-CM | POA: Diagnosis not present

## 2016-08-16 DIAGNOSIS — G4730 Sleep apnea, unspecified: Secondary | ICD-10-CM | POA: Diagnosis not present

## 2016-08-16 DIAGNOSIS — I10 Essential (primary) hypertension: Secondary | ICD-10-CM | POA: Diagnosis not present

## 2016-08-16 DIAGNOSIS — G1221 Amyotrophic lateral sclerosis: Secondary | ICD-10-CM | POA: Diagnosis not present

## 2016-08-16 DIAGNOSIS — E119 Type 2 diabetes mellitus without complications: Secondary | ICD-10-CM | POA: Diagnosis not present

## 2016-08-16 DIAGNOSIS — J42 Unspecified chronic bronchitis: Secondary | ICD-10-CM | POA: Diagnosis not present

## 2016-08-16 DIAGNOSIS — E785 Hyperlipidemia, unspecified: Secondary | ICD-10-CM | POA: Diagnosis not present

## 2016-08-17 DIAGNOSIS — R338 Other retention of urine: Secondary | ICD-10-CM | POA: Diagnosis not present

## 2016-08-17 DIAGNOSIS — J42 Unspecified chronic bronchitis: Secondary | ICD-10-CM | POA: Diagnosis not present

## 2016-08-17 DIAGNOSIS — E119 Type 2 diabetes mellitus without complications: Secondary | ICD-10-CM | POA: Diagnosis not present

## 2016-08-17 DIAGNOSIS — G4730 Sleep apnea, unspecified: Secondary | ICD-10-CM | POA: Diagnosis not present

## 2016-08-17 DIAGNOSIS — E785 Hyperlipidemia, unspecified: Secondary | ICD-10-CM | POA: Diagnosis not present

## 2016-08-17 DIAGNOSIS — I10 Essential (primary) hypertension: Secondary | ICD-10-CM | POA: Diagnosis not present

## 2016-08-17 DIAGNOSIS — G1221 Amyotrophic lateral sclerosis: Secondary | ICD-10-CM | POA: Diagnosis not present

## 2016-08-18 DIAGNOSIS — E785 Hyperlipidemia, unspecified: Secondary | ICD-10-CM | POA: Diagnosis not present

## 2016-08-18 DIAGNOSIS — E119 Type 2 diabetes mellitus without complications: Secondary | ICD-10-CM | POA: Diagnosis not present

## 2016-08-18 DIAGNOSIS — G1221 Amyotrophic lateral sclerosis: Secondary | ICD-10-CM | POA: Diagnosis not present

## 2016-08-18 DIAGNOSIS — J42 Unspecified chronic bronchitis: Secondary | ICD-10-CM | POA: Diagnosis not present

## 2016-08-18 DIAGNOSIS — G4730 Sleep apnea, unspecified: Secondary | ICD-10-CM | POA: Diagnosis not present

## 2016-08-18 DIAGNOSIS — I10 Essential (primary) hypertension: Secondary | ICD-10-CM | POA: Diagnosis not present

## 2016-08-21 DIAGNOSIS — G4730 Sleep apnea, unspecified: Secondary | ICD-10-CM | POA: Diagnosis not present

## 2016-08-21 DIAGNOSIS — I10 Essential (primary) hypertension: Secondary | ICD-10-CM | POA: Diagnosis not present

## 2016-08-21 DIAGNOSIS — E785 Hyperlipidemia, unspecified: Secondary | ICD-10-CM | POA: Diagnosis not present

## 2016-08-21 DIAGNOSIS — E119 Type 2 diabetes mellitus without complications: Secondary | ICD-10-CM | POA: Diagnosis not present

## 2016-08-21 DIAGNOSIS — J42 Unspecified chronic bronchitis: Secondary | ICD-10-CM | POA: Diagnosis not present

## 2016-08-21 DIAGNOSIS — G1221 Amyotrophic lateral sclerosis: Secondary | ICD-10-CM | POA: Diagnosis not present

## 2016-08-23 DIAGNOSIS — G4730 Sleep apnea, unspecified: Secondary | ICD-10-CM | POA: Diagnosis not present

## 2016-08-23 DIAGNOSIS — J42 Unspecified chronic bronchitis: Secondary | ICD-10-CM | POA: Diagnosis not present

## 2016-08-23 DIAGNOSIS — I10 Essential (primary) hypertension: Secondary | ICD-10-CM | POA: Diagnosis not present

## 2016-08-23 DIAGNOSIS — E119 Type 2 diabetes mellitus without complications: Secondary | ICD-10-CM | POA: Diagnosis not present

## 2016-08-23 DIAGNOSIS — E785 Hyperlipidemia, unspecified: Secondary | ICD-10-CM | POA: Diagnosis not present

## 2016-08-23 DIAGNOSIS — G1221 Amyotrophic lateral sclerosis: Secondary | ICD-10-CM | POA: Diagnosis not present

## 2016-08-25 DIAGNOSIS — J42 Unspecified chronic bronchitis: Secondary | ICD-10-CM | POA: Diagnosis not present

## 2016-08-25 DIAGNOSIS — G4730 Sleep apnea, unspecified: Secondary | ICD-10-CM | POA: Diagnosis not present

## 2016-08-25 DIAGNOSIS — E785 Hyperlipidemia, unspecified: Secondary | ICD-10-CM | POA: Diagnosis not present

## 2016-08-25 DIAGNOSIS — G1221 Amyotrophic lateral sclerosis: Secondary | ICD-10-CM | POA: Diagnosis not present

## 2016-08-25 DIAGNOSIS — E119 Type 2 diabetes mellitus without complications: Secondary | ICD-10-CM | POA: Diagnosis not present

## 2016-08-25 DIAGNOSIS — I10 Essential (primary) hypertension: Secondary | ICD-10-CM | POA: Diagnosis not present

## 2016-08-27 DIAGNOSIS — N401 Enlarged prostate with lower urinary tract symptoms: Secondary | ICD-10-CM | POA: Diagnosis not present

## 2016-08-27 DIAGNOSIS — F411 Generalized anxiety disorder: Secondary | ICD-10-CM | POA: Diagnosis not present

## 2016-08-27 DIAGNOSIS — E785 Hyperlipidemia, unspecified: Secondary | ICD-10-CM | POA: Diagnosis not present

## 2016-08-27 DIAGNOSIS — F339 Major depressive disorder, recurrent, unspecified: Secondary | ICD-10-CM | POA: Diagnosis not present

## 2016-08-27 DIAGNOSIS — R0600 Dyspnea, unspecified: Secondary | ICD-10-CM | POA: Diagnosis not present

## 2016-08-27 DIAGNOSIS — E119 Type 2 diabetes mellitus without complications: Secondary | ICD-10-CM | POA: Diagnosis not present

## 2016-08-27 DIAGNOSIS — J42 Unspecified chronic bronchitis: Secondary | ICD-10-CM | POA: Diagnosis not present

## 2016-08-27 DIAGNOSIS — G4730 Sleep apnea, unspecified: Secondary | ICD-10-CM | POA: Diagnosis not present

## 2016-08-27 DIAGNOSIS — I10 Essential (primary) hypertension: Secondary | ICD-10-CM | POA: Diagnosis not present

## 2016-08-27 DIAGNOSIS — G1221 Amyotrophic lateral sclerosis: Secondary | ICD-10-CM | POA: Diagnosis not present

## 2016-08-27 DIAGNOSIS — R339 Retention of urine, unspecified: Secondary | ICD-10-CM | POA: Diagnosis not present

## 2016-08-28 DIAGNOSIS — J42 Unspecified chronic bronchitis: Secondary | ICD-10-CM | POA: Diagnosis not present

## 2016-08-28 DIAGNOSIS — R069 Unspecified abnormalities of breathing: Secondary | ICD-10-CM | POA: Diagnosis not present

## 2016-08-28 DIAGNOSIS — G4730 Sleep apnea, unspecified: Secondary | ICD-10-CM | POA: Diagnosis not present

## 2016-08-28 DIAGNOSIS — E119 Type 2 diabetes mellitus without complications: Secondary | ICD-10-CM | POA: Diagnosis not present

## 2016-08-28 DIAGNOSIS — N390 Urinary tract infection, site not specified: Secondary | ICD-10-CM | POA: Diagnosis not present

## 2016-08-28 DIAGNOSIS — T1490XA Injury, unspecified, initial encounter: Secondary | ICD-10-CM | POA: Diagnosis not present

## 2016-08-28 DIAGNOSIS — G1221 Amyotrophic lateral sclerosis: Secondary | ICD-10-CM | POA: Diagnosis not present

## 2016-08-28 DIAGNOSIS — I10 Essential (primary) hypertension: Secondary | ICD-10-CM | POA: Diagnosis not present

## 2016-08-28 DIAGNOSIS — E785 Hyperlipidemia, unspecified: Secondary | ICD-10-CM | POA: Diagnosis not present

## 2016-08-30 DIAGNOSIS — G4730 Sleep apnea, unspecified: Secondary | ICD-10-CM | POA: Diagnosis not present

## 2016-08-30 DIAGNOSIS — J42 Unspecified chronic bronchitis: Secondary | ICD-10-CM | POA: Diagnosis not present

## 2016-08-30 DIAGNOSIS — E119 Type 2 diabetes mellitus without complications: Secondary | ICD-10-CM | POA: Diagnosis not present

## 2016-08-30 DIAGNOSIS — E785 Hyperlipidemia, unspecified: Secondary | ICD-10-CM | POA: Diagnosis not present

## 2016-08-30 DIAGNOSIS — G1221 Amyotrophic lateral sclerosis: Secondary | ICD-10-CM | POA: Diagnosis not present

## 2016-08-30 DIAGNOSIS — I10 Essential (primary) hypertension: Secondary | ICD-10-CM | POA: Diagnosis not present

## 2016-08-31 DIAGNOSIS — G4730 Sleep apnea, unspecified: Secondary | ICD-10-CM | POA: Diagnosis not present

## 2016-08-31 DIAGNOSIS — E785 Hyperlipidemia, unspecified: Secondary | ICD-10-CM | POA: Diagnosis not present

## 2016-08-31 DIAGNOSIS — J42 Unspecified chronic bronchitis: Secondary | ICD-10-CM | POA: Diagnosis not present

## 2016-08-31 DIAGNOSIS — G1221 Amyotrophic lateral sclerosis: Secondary | ICD-10-CM | POA: Diagnosis not present

## 2016-08-31 DIAGNOSIS — I10 Essential (primary) hypertension: Secondary | ICD-10-CM | POA: Diagnosis not present

## 2016-08-31 DIAGNOSIS — E119 Type 2 diabetes mellitus without complications: Secondary | ICD-10-CM | POA: Diagnosis not present

## 2016-09-01 DIAGNOSIS — E119 Type 2 diabetes mellitus without complications: Secondary | ICD-10-CM | POA: Diagnosis not present

## 2016-09-01 DIAGNOSIS — G4730 Sleep apnea, unspecified: Secondary | ICD-10-CM | POA: Diagnosis not present

## 2016-09-01 DIAGNOSIS — I10 Essential (primary) hypertension: Secondary | ICD-10-CM | POA: Diagnosis not present

## 2016-09-01 DIAGNOSIS — J42 Unspecified chronic bronchitis: Secondary | ICD-10-CM | POA: Diagnosis not present

## 2016-09-01 DIAGNOSIS — G1221 Amyotrophic lateral sclerosis: Secondary | ICD-10-CM | POA: Diagnosis not present

## 2016-09-01 DIAGNOSIS — E785 Hyperlipidemia, unspecified: Secondary | ICD-10-CM | POA: Diagnosis not present

## 2016-09-04 DIAGNOSIS — X32XXXD Exposure to sunlight, subsequent encounter: Secondary | ICD-10-CM | POA: Diagnosis not present

## 2016-09-04 DIAGNOSIS — L570 Actinic keratosis: Secondary | ICD-10-CM | POA: Diagnosis not present

## 2016-09-04 DIAGNOSIS — H61002 Unspecified perichondritis of left external ear: Secondary | ICD-10-CM | POA: Diagnosis not present

## 2016-09-04 DIAGNOSIS — L258 Unspecified contact dermatitis due to other agents: Secondary | ICD-10-CM | POA: Diagnosis not present

## 2016-09-06 DIAGNOSIS — G4730 Sleep apnea, unspecified: Secondary | ICD-10-CM | POA: Diagnosis not present

## 2016-09-06 DIAGNOSIS — G1221 Amyotrophic lateral sclerosis: Secondary | ICD-10-CM | POA: Diagnosis not present

## 2016-09-06 DIAGNOSIS — I10 Essential (primary) hypertension: Secondary | ICD-10-CM | POA: Diagnosis not present

## 2016-09-06 DIAGNOSIS — E785 Hyperlipidemia, unspecified: Secondary | ICD-10-CM | POA: Diagnosis not present

## 2016-09-06 DIAGNOSIS — E119 Type 2 diabetes mellitus without complications: Secondary | ICD-10-CM | POA: Diagnosis not present

## 2016-09-06 DIAGNOSIS — J42 Unspecified chronic bronchitis: Secondary | ICD-10-CM | POA: Diagnosis not present

## 2016-09-07 DIAGNOSIS — G1221 Amyotrophic lateral sclerosis: Secondary | ICD-10-CM | POA: Diagnosis not present

## 2016-09-07 DIAGNOSIS — I10 Essential (primary) hypertension: Secondary | ICD-10-CM | POA: Diagnosis not present

## 2016-09-07 DIAGNOSIS — J42 Unspecified chronic bronchitis: Secondary | ICD-10-CM | POA: Diagnosis not present

## 2016-09-07 DIAGNOSIS — G4730 Sleep apnea, unspecified: Secondary | ICD-10-CM | POA: Diagnosis not present

## 2016-09-07 DIAGNOSIS — E119 Type 2 diabetes mellitus without complications: Secondary | ICD-10-CM | POA: Diagnosis not present

## 2016-09-07 DIAGNOSIS — E785 Hyperlipidemia, unspecified: Secondary | ICD-10-CM | POA: Diagnosis not present

## 2016-09-11 DIAGNOSIS — G1221 Amyotrophic lateral sclerosis: Secondary | ICD-10-CM | POA: Diagnosis not present

## 2016-09-11 DIAGNOSIS — R339 Retention of urine, unspecified: Secondary | ICD-10-CM | POA: Diagnosis not present

## 2016-09-13 DIAGNOSIS — G4730 Sleep apnea, unspecified: Secondary | ICD-10-CM | POA: Diagnosis not present

## 2016-09-13 DIAGNOSIS — E785 Hyperlipidemia, unspecified: Secondary | ICD-10-CM | POA: Diagnosis not present

## 2016-09-13 DIAGNOSIS — J42 Unspecified chronic bronchitis: Secondary | ICD-10-CM | POA: Diagnosis not present

## 2016-09-13 DIAGNOSIS — G1221 Amyotrophic lateral sclerosis: Secondary | ICD-10-CM | POA: Diagnosis not present

## 2016-09-13 DIAGNOSIS — I10 Essential (primary) hypertension: Secondary | ICD-10-CM | POA: Diagnosis not present

## 2016-09-13 DIAGNOSIS — E119 Type 2 diabetes mellitus without complications: Secondary | ICD-10-CM | POA: Diagnosis not present

## 2016-09-14 DIAGNOSIS — J42 Unspecified chronic bronchitis: Secondary | ICD-10-CM | POA: Diagnosis not present

## 2016-09-14 DIAGNOSIS — E119 Type 2 diabetes mellitus without complications: Secondary | ICD-10-CM | POA: Diagnosis not present

## 2016-09-14 DIAGNOSIS — I10 Essential (primary) hypertension: Secondary | ICD-10-CM | POA: Diagnosis not present

## 2016-09-14 DIAGNOSIS — G4730 Sleep apnea, unspecified: Secondary | ICD-10-CM | POA: Diagnosis not present

## 2016-09-14 DIAGNOSIS — E785 Hyperlipidemia, unspecified: Secondary | ICD-10-CM | POA: Diagnosis not present

## 2016-09-14 DIAGNOSIS — G1221 Amyotrophic lateral sclerosis: Secondary | ICD-10-CM | POA: Diagnosis not present

## 2016-09-15 DIAGNOSIS — G1221 Amyotrophic lateral sclerosis: Secondary | ICD-10-CM | POA: Diagnosis not present

## 2016-09-15 DIAGNOSIS — E119 Type 2 diabetes mellitus without complications: Secondary | ICD-10-CM | POA: Diagnosis not present

## 2016-09-15 DIAGNOSIS — J42 Unspecified chronic bronchitis: Secondary | ICD-10-CM | POA: Diagnosis not present

## 2016-09-15 DIAGNOSIS — E785 Hyperlipidemia, unspecified: Secondary | ICD-10-CM | POA: Diagnosis not present

## 2016-09-15 DIAGNOSIS — I10 Essential (primary) hypertension: Secondary | ICD-10-CM | POA: Diagnosis not present

## 2016-09-15 DIAGNOSIS — G4730 Sleep apnea, unspecified: Secondary | ICD-10-CM | POA: Diagnosis not present

## 2016-09-18 DIAGNOSIS — G1221 Amyotrophic lateral sclerosis: Secondary | ICD-10-CM | POA: Diagnosis not present

## 2016-09-18 DIAGNOSIS — E785 Hyperlipidemia, unspecified: Secondary | ICD-10-CM | POA: Diagnosis not present

## 2016-09-18 DIAGNOSIS — E119 Type 2 diabetes mellitus without complications: Secondary | ICD-10-CM | POA: Diagnosis not present

## 2016-09-18 DIAGNOSIS — G4730 Sleep apnea, unspecified: Secondary | ICD-10-CM | POA: Diagnosis not present

## 2016-09-18 DIAGNOSIS — I10 Essential (primary) hypertension: Secondary | ICD-10-CM | POA: Diagnosis not present

## 2016-09-18 DIAGNOSIS — J42 Unspecified chronic bronchitis: Secondary | ICD-10-CM | POA: Diagnosis not present

## 2016-09-20 DIAGNOSIS — E119 Type 2 diabetes mellitus without complications: Secondary | ICD-10-CM | POA: Diagnosis not present

## 2016-09-20 DIAGNOSIS — G4730 Sleep apnea, unspecified: Secondary | ICD-10-CM | POA: Diagnosis not present

## 2016-09-20 DIAGNOSIS — G1221 Amyotrophic lateral sclerosis: Secondary | ICD-10-CM | POA: Diagnosis not present

## 2016-09-20 DIAGNOSIS — E785 Hyperlipidemia, unspecified: Secondary | ICD-10-CM | POA: Diagnosis not present

## 2016-09-20 DIAGNOSIS — I10 Essential (primary) hypertension: Secondary | ICD-10-CM | POA: Diagnosis not present

## 2016-09-20 DIAGNOSIS — J42 Unspecified chronic bronchitis: Secondary | ICD-10-CM | POA: Diagnosis not present

## 2016-09-22 DIAGNOSIS — H1032 Unspecified acute conjunctivitis, left eye: Secondary | ICD-10-CM | POA: Diagnosis not present

## 2016-09-23 DIAGNOSIS — G4730 Sleep apnea, unspecified: Secondary | ICD-10-CM | POA: Diagnosis not present

## 2016-09-23 DIAGNOSIS — I10 Essential (primary) hypertension: Secondary | ICD-10-CM | POA: Diagnosis not present

## 2016-09-23 DIAGNOSIS — E785 Hyperlipidemia, unspecified: Secondary | ICD-10-CM | POA: Diagnosis not present

## 2016-09-23 DIAGNOSIS — E119 Type 2 diabetes mellitus without complications: Secondary | ICD-10-CM | POA: Diagnosis not present

## 2016-09-23 DIAGNOSIS — G1221 Amyotrophic lateral sclerosis: Secondary | ICD-10-CM | POA: Diagnosis not present

## 2016-09-23 DIAGNOSIS — J42 Unspecified chronic bronchitis: Secondary | ICD-10-CM | POA: Diagnosis not present

## 2016-09-25 DIAGNOSIS — I10 Essential (primary) hypertension: Secondary | ICD-10-CM | POA: Diagnosis not present

## 2016-09-25 DIAGNOSIS — G4730 Sleep apnea, unspecified: Secondary | ICD-10-CM | POA: Diagnosis not present

## 2016-09-25 DIAGNOSIS — J42 Unspecified chronic bronchitis: Secondary | ICD-10-CM | POA: Diagnosis not present

## 2016-09-25 DIAGNOSIS — E119 Type 2 diabetes mellitus without complications: Secondary | ICD-10-CM | POA: Diagnosis not present

## 2016-09-25 DIAGNOSIS — G1221 Amyotrophic lateral sclerosis: Secondary | ICD-10-CM | POA: Diagnosis not present

## 2016-09-25 DIAGNOSIS — E785 Hyperlipidemia, unspecified: Secondary | ICD-10-CM | POA: Diagnosis not present

## 2016-09-26 DIAGNOSIS — F411 Generalized anxiety disorder: Secondary | ICD-10-CM | POA: Diagnosis not present

## 2016-09-26 DIAGNOSIS — J42 Unspecified chronic bronchitis: Secondary | ICD-10-CM | POA: Diagnosis not present

## 2016-09-26 DIAGNOSIS — R339 Retention of urine, unspecified: Secondary | ICD-10-CM | POA: Diagnosis not present

## 2016-09-26 DIAGNOSIS — N401 Enlarged prostate with lower urinary tract symptoms: Secondary | ICD-10-CM | POA: Diagnosis not present

## 2016-09-26 DIAGNOSIS — G4730 Sleep apnea, unspecified: Secondary | ICD-10-CM | POA: Diagnosis not present

## 2016-09-26 DIAGNOSIS — E785 Hyperlipidemia, unspecified: Secondary | ICD-10-CM | POA: Diagnosis not present

## 2016-09-26 DIAGNOSIS — R0600 Dyspnea, unspecified: Secondary | ICD-10-CM | POA: Diagnosis not present

## 2016-09-26 DIAGNOSIS — E119 Type 2 diabetes mellitus without complications: Secondary | ICD-10-CM | POA: Diagnosis not present

## 2016-09-26 DIAGNOSIS — I10 Essential (primary) hypertension: Secondary | ICD-10-CM | POA: Diagnosis not present

## 2016-09-26 DIAGNOSIS — G1221 Amyotrophic lateral sclerosis: Secondary | ICD-10-CM | POA: Diagnosis not present

## 2016-09-26 DIAGNOSIS — F339 Major depressive disorder, recurrent, unspecified: Secondary | ICD-10-CM | POA: Diagnosis not present

## 2016-09-27 DIAGNOSIS — G4730 Sleep apnea, unspecified: Secondary | ICD-10-CM | POA: Diagnosis not present

## 2016-09-27 DIAGNOSIS — G1221 Amyotrophic lateral sclerosis: Secondary | ICD-10-CM | POA: Diagnosis not present

## 2016-09-27 DIAGNOSIS — R8290 Unspecified abnormal findings in urine: Secondary | ICD-10-CM | POA: Diagnosis not present

## 2016-09-27 DIAGNOSIS — J42 Unspecified chronic bronchitis: Secondary | ICD-10-CM | POA: Diagnosis not present

## 2016-09-27 DIAGNOSIS — E119 Type 2 diabetes mellitus without complications: Secondary | ICD-10-CM | POA: Diagnosis not present

## 2016-09-27 DIAGNOSIS — E785 Hyperlipidemia, unspecified: Secondary | ICD-10-CM | POA: Diagnosis not present

## 2016-09-27 DIAGNOSIS — I10 Essential (primary) hypertension: Secondary | ICD-10-CM | POA: Diagnosis not present

## 2016-09-28 DIAGNOSIS — G4730 Sleep apnea, unspecified: Secondary | ICD-10-CM | POA: Diagnosis not present

## 2016-09-28 DIAGNOSIS — E119 Type 2 diabetes mellitus without complications: Secondary | ICD-10-CM | POA: Diagnosis not present

## 2016-09-28 DIAGNOSIS — E785 Hyperlipidemia, unspecified: Secondary | ICD-10-CM | POA: Diagnosis not present

## 2016-09-28 DIAGNOSIS — I10 Essential (primary) hypertension: Secondary | ICD-10-CM | POA: Diagnosis not present

## 2016-09-28 DIAGNOSIS — J42 Unspecified chronic bronchitis: Secondary | ICD-10-CM | POA: Diagnosis not present

## 2016-09-28 DIAGNOSIS — G1221 Amyotrophic lateral sclerosis: Secondary | ICD-10-CM | POA: Diagnosis not present

## 2016-09-29 DIAGNOSIS — J42 Unspecified chronic bronchitis: Secondary | ICD-10-CM | POA: Diagnosis not present

## 2016-09-29 DIAGNOSIS — E119 Type 2 diabetes mellitus without complications: Secondary | ICD-10-CM | POA: Diagnosis not present

## 2016-09-29 DIAGNOSIS — I10 Essential (primary) hypertension: Secondary | ICD-10-CM | POA: Diagnosis not present

## 2016-09-29 DIAGNOSIS — G4730 Sleep apnea, unspecified: Secondary | ICD-10-CM | POA: Diagnosis not present

## 2016-09-29 DIAGNOSIS — E785 Hyperlipidemia, unspecified: Secondary | ICD-10-CM | POA: Diagnosis not present

## 2016-09-29 DIAGNOSIS — G1221 Amyotrophic lateral sclerosis: Secondary | ICD-10-CM | POA: Diagnosis not present

## 2016-09-30 DIAGNOSIS — E785 Hyperlipidemia, unspecified: Secondary | ICD-10-CM | POA: Diagnosis not present

## 2016-09-30 DIAGNOSIS — I10 Essential (primary) hypertension: Secondary | ICD-10-CM | POA: Diagnosis not present

## 2016-09-30 DIAGNOSIS — J42 Unspecified chronic bronchitis: Secondary | ICD-10-CM | POA: Diagnosis not present

## 2016-09-30 DIAGNOSIS — E119 Type 2 diabetes mellitus without complications: Secondary | ICD-10-CM | POA: Diagnosis not present

## 2016-09-30 DIAGNOSIS — G1221 Amyotrophic lateral sclerosis: Secondary | ICD-10-CM | POA: Diagnosis not present

## 2016-09-30 DIAGNOSIS — G4730 Sleep apnea, unspecified: Secondary | ICD-10-CM | POA: Diagnosis not present

## 2016-10-27 DEATH — deceased

## 2016-12-19 ENCOUNTER — Ambulatory Visit: Payer: Medicare Other | Admitting: Neurology

## 2017-06-13 IMAGING — CR DG CHEST 2V
2 series · 2 of 2 positions shown · non-contrast
Comparison: 04/06/2014

CLINICAL DATA: Dyspnea.  Wheezing.

EXAM:
CHEST  2 VIEW

[w chest pa]
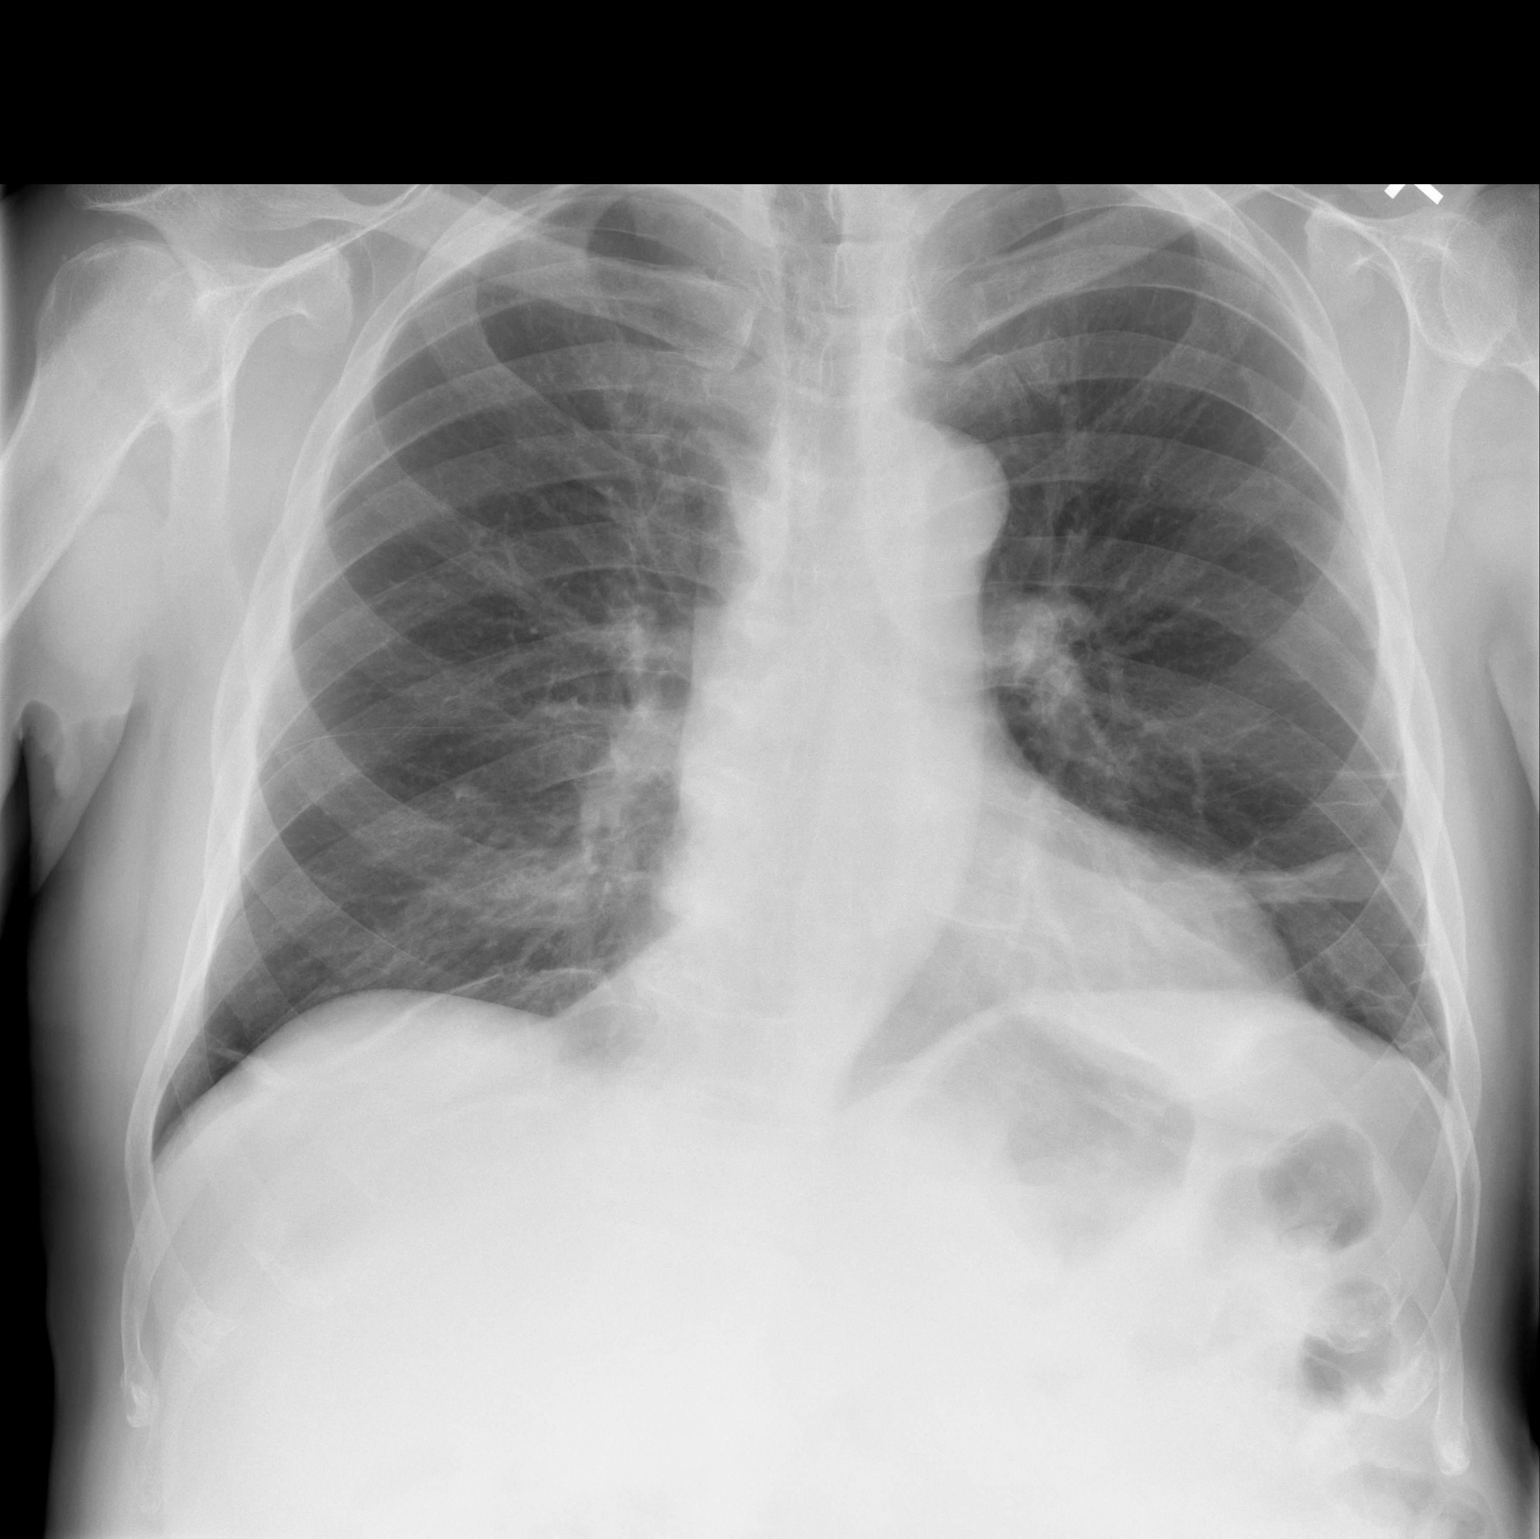

[w chest lat]
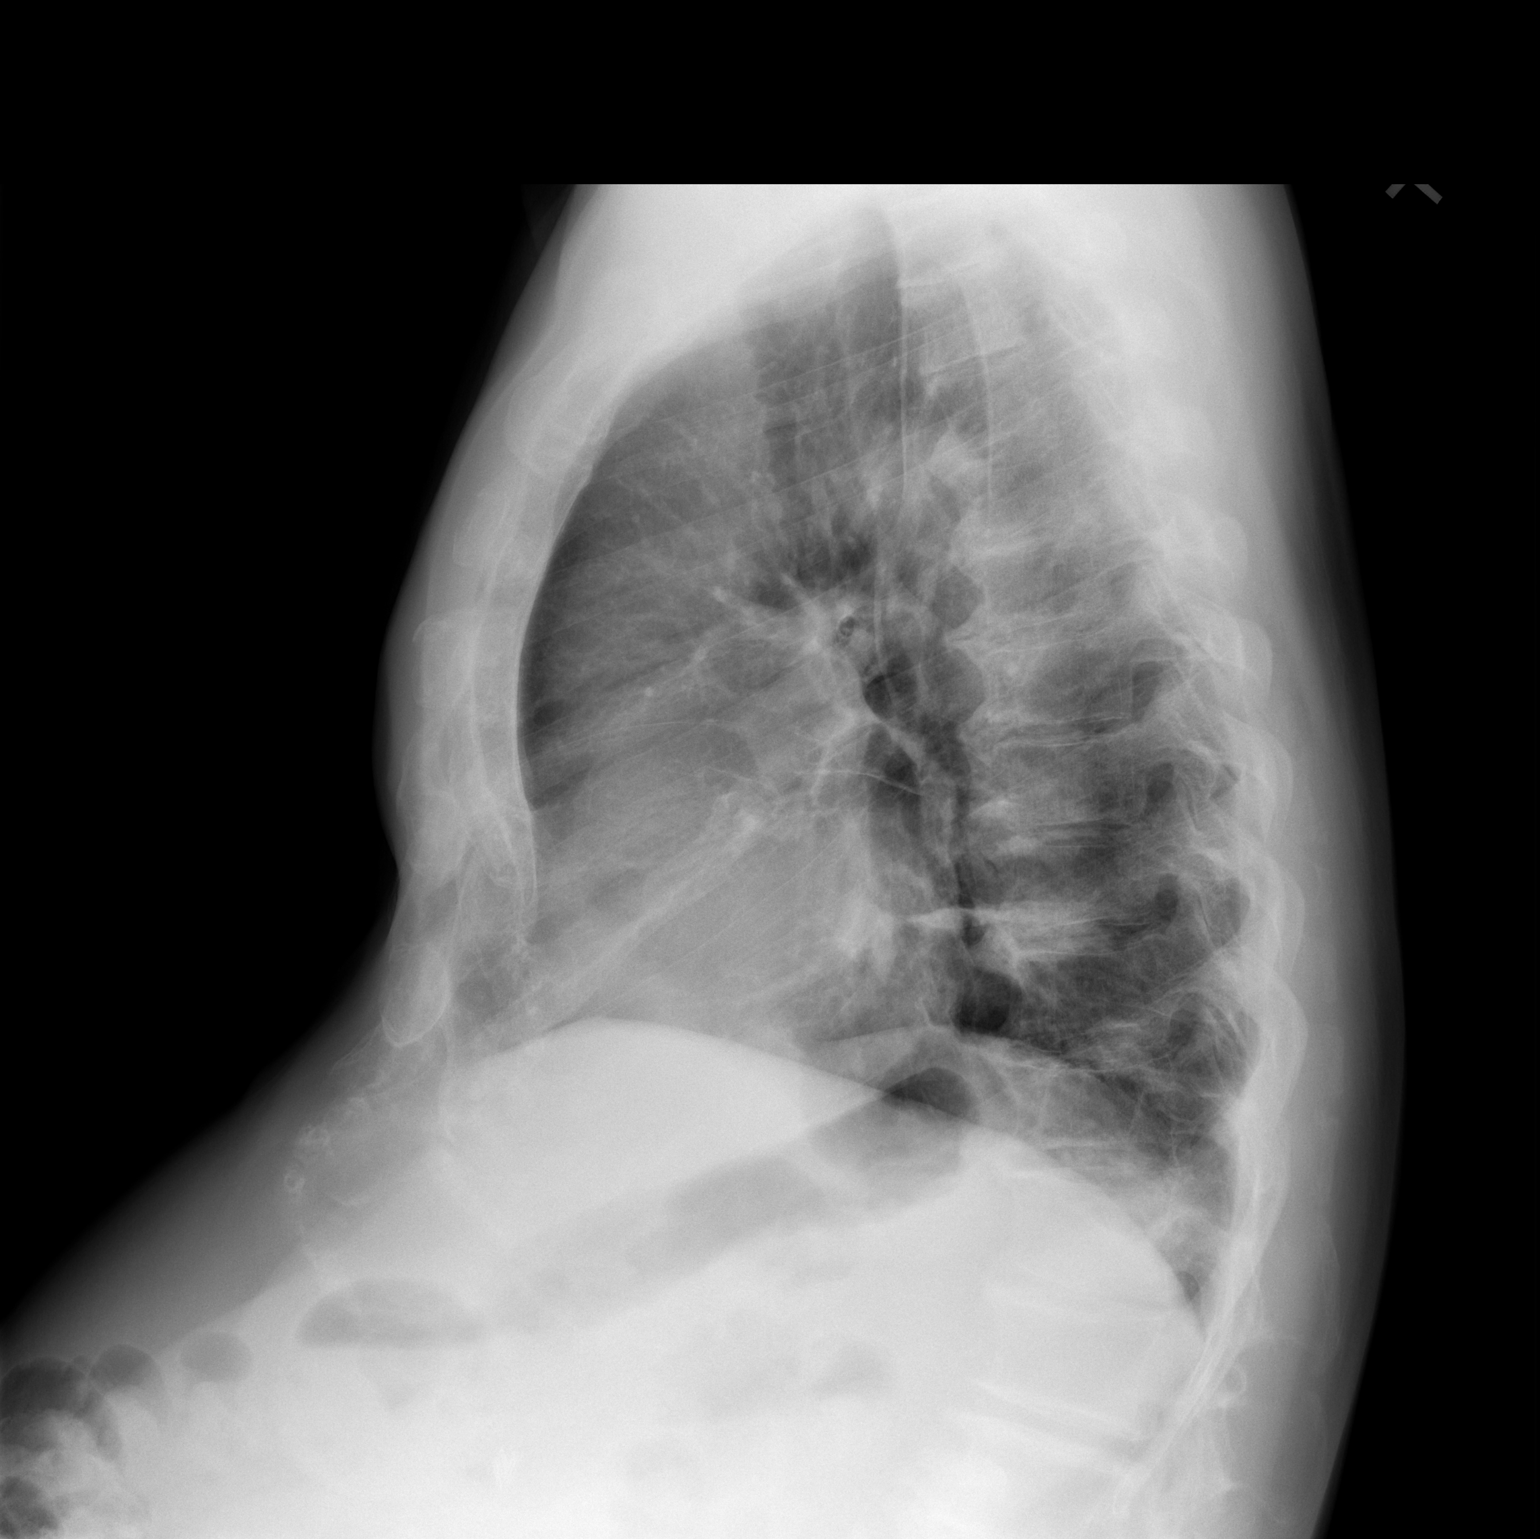

[2 of 2 positions shown; findings below may reference images not displayed]

FINDINGS: Linear densities present in both lung bases have developed since
prior study most likely due to atelectasis. No definite pneumonia.
Negative for heart failure or effusion. Negative for mass or
adenopathy.
IMPRESSION: Bibasilar atelectasis.

## 2017-06-21 IMAGING — XA DG FLUORO GUIDE LUMBAR PUNCTURE
1 series · 1 of 1 positions shown · non-contrast
Comparison: none

CLINICAL DATA: Lyme disease.

[Series 1: ortho standard · 1 of 1 slices shown]
[im 1/1]
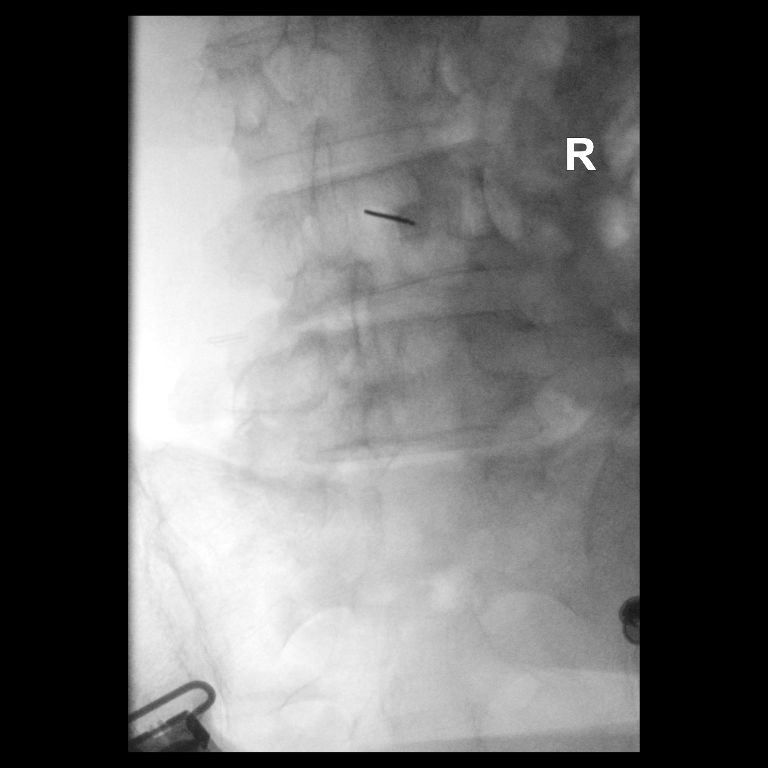

[1 of 1 positions shown; findings below may reference images not displayed]

EXAM:
DIAGNOSTIC LUMBAR PUNCTURE UNDER FLUOROSCOPIC GUIDANCE

FLUOROSCOPY TIME:  Fluoroscopy Time:  8 seconds

Radiation Exposure Index (if provided by the fluoroscopic device):
64.86 microGray*m^2

Number of Acquired Spot Images: 0

PROCEDURE:
Informed consent was obtained from the patient prior to the
procedure, including potential complications of headache, allergy,
and pain. With the patient in the left lateral decubitus position,
the lower back was prepped with Betadine. 1% Lidocaine was used for
local anesthesia. Lumbar puncture was performed at the L3-4 level
using a 3.5 inch 20 gauge needle with return of initially
blood-tinged CSF which subsequently cleared. Opening pressure was 13
cm water. 11 ml of CSF were obtained for laboratory studies. The
patient tolerated the procedure well and there were no apparent
complications.
IMPRESSION: Successful fluoroscopic guided lumbar puncture.
